# Patient Record
Sex: Female | Born: 2000 | Hispanic: No | Marital: Single | Smoking: Current every day smoker
Health system: Southern US, Community
[De-identification: ages and names within clinical notes are randomized; demographics above are authoritative.]

## PROBLEM LIST (undated history)

## (undated) DIAGNOSIS — R569 Unspecified convulsions: Secondary | ICD-10-CM

---
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
## 2021-06-10 DIAGNOSIS — F1990 Other psychoactive substance use, unspecified, uncomplicated: Secondary | ICD-10-CM | POA: Insufficient documentation

## 2021-07-06 ENCOUNTER — Other Ambulatory Visit: Payer: Self-pay

## 2021-07-06 ENCOUNTER — Encounter: Payer: Self-pay | Admitting: Neurology

## 2021-07-06 ENCOUNTER — Ambulatory Visit: Payer: Medicaid Other | Admitting: Neurology

## 2021-07-06 VITALS — Ht 62.0 in | Wt 110.0 lb

## 2021-07-06 DIAGNOSIS — G40909 Epilepsy, unspecified, not intractable, without status epilepticus: Secondary | ICD-10-CM

## 2021-07-06 MED ORDER — LAMOTRIGINE 25 MG PO TABS: ORAL_TABLET | ORAL | 0 refills | Status: DC

## 2021-07-06 NOTE — Patient Instructions (Addendum)
Continue with Topamax 100 mg twice daily  ?Start with lamotrigine as below  ?Week 1: 25mg  (one pill) at night  ?Week 2: 25mg  (one pill) twice daily ?Week 3: 25mg  (one pill) morning and 50mg  (two pills) night  ?Week 4: 50mg  (two pills) twice daily  ?Week 5: 75mg  (three pills) twice daily ?Week 6: 100mg  (fours pills) twice daily ?Will obtain a Lamotrigine level (blood test) and complete metabolic panel after week 6  ?Please stop the medication and call the office as soon as you develop a new rash ? ?Please contact me to let me know the name of the oral mouth wash.  ? ?Return in 6 weeks  ? ? ?

## 2021-07-06 NOTE — Progress Notes (Signed)
[facility]  PATIENT: Janet SpareMikayla Lacey Kalata DOB: 05/30/2000  REQUESTING CLINICIAN: Eula Friedhamberlin, Patricia, MD HISTORY FROM: Patient and mother  REASON FOR VISIT: New onset seizure   HISTORICAL  CHIEF COMPLAINT:  Chief Complaint  Patient presents with   New Patient (Initial Visit)    Rm 15, with mother  NP/Paper/[facility]/Patricia Erlene Sentershamberlin MD [phone]/new onset seizures/ States the increased dose of Topamax makes her feel bad      HISTORY OF PRESENT ILLNESS:  This is a 21 year old woman with past medical history of anxiety, PTSD, migraine headaches who is presenting after being admitted to the hospital for new onset seizure.  Patient reports on February 7 she had back-to-back seizures, a total of 4 seizures described as generalized convulsion.  She was admitted at [facility] for total of 3 days.  She did have a brain MRI which showed no acute intracranial abnormality and her EEG did not show any seizures.  Due to her history of anxiety and migraine headaches she was started on topiramate.  Currently she is on topiramate 100 mg twice daily, describes side effect of brain fog, difficulty with concentrating and also feels like she is "more fast" meaning that her ADD symptoms are getting worse.  Since leaving the hospital she denies any additional seizure or seizure-like event.  States that her headaches are better controlled.  She denies any previous history of seizures, denies any seizure risk factors other than the fact that she was born 3 weeks premature, with symptom of crack withdrawal and was in the ICU for couple weeks. She does report a history of marijuana abuse and believes that the marijuana she used was laced with cocaine causing her seizures.  In the hospital her urine toxicology was positive for THC and cocaine.  Handedness: Right handed   Onset: February 7  Seizure Type: Generalized convulsion  Current frequency: 1 event of seizure  cluster  Any injuries from seizures: Tongue biting  Seizure risk factors: Premature 3 weeks, was in the ICU for 2 weeks for drug withdrawal   Previous ASMs: None  Currenty ASMs: Levetiracetam  ASMs side effects: Brain fog, difficulty with concentration  Brain Images: Normal brain MRI  Previous EEGs: Normal EEG   OTHER MEDICAL CONDITIONS: Migraines headaches, Depression, PTSD   REVIEW OF SYSTEMS: Full 14 system review of systems performed and negative with exception of: As noted in the HPI  ALLERGIES: Allergies  Allergen Reactions   Influenza Vaccines     Other reaction(s): Other (See Comments) Pt states it makes her sick with fever and nausea     HOME MEDICATIONS: Outpatient Medications Prior to Visit  Medication Sig Dispense Refill   FOLIC ACID PO Take by mouth.     topiramate (TOPAMAX) 100 MG tablet Take by mouth.     No facility-administered medications prior to visit.    PAST MEDICAL HISTORY: History reviewed. No pertinent past medical history.  PAST SURGICAL HISTORY: History reviewed. No pertinent surgical history.  FAMILY HISTORY: History reviewed. No pertinent family history.  SOCIAL HISTORY: Social History   Socioeconomic History   Marital status: Single    Spouse name: Not on file   Number of children: Not on file   Years of education: Not on file   Highest education level: Not on file  Occupational History   Not on file  Tobacco Use   Smoking status: Unknown   Smokeless tobacco: Not on file  Substance and Sexual Activity   Alcohol use: Not on  file   Drug use: Not on file   Sexual activity: Not on file  Other Topics Concern   Not on file  Social History Narrative   Not on file   Social Determinants of Health   Financial Resource Strain: Not on file  Food Insecurity: Not on file  Transportation Needs: Not on file  Physical Activity: Not on file  Stress: Not on file  Social Connections: Not on file  Intimate Partner Violence: Not  on file     PHYSICAL EXAM    GENERAL EXAM/CONSTITUTIONAL: Vitals:  Vitals:   07/06/21 1440  Weight: 110 lb (49.9 kg)  Height: 5\' 2"  (1.575 m)   Body mass index is 20.12 kg/m. Wt Readings from Last 3 Encounters:  07/06/21 110 lb (49.9 kg)   Patient is in no distress; well developed, nourished and groomed; neck is supple  CARDIOVASCULAR: Examination of carotid arteries is normal; no carotid bruits Regular rate and rhythm, no murmurs Examination of peripheral vascular system by observation and palpation is normal  EYES: Pupils round and reactive to light, Visual fields full to confrontation, Extraocular movements intacts,  No results found.  MUSCULOSKELETAL: Gait, strength, tone, movements noted in Neurologic exam below  NEUROLOGIC: MENTAL STATUS:  No flowsheet data found. awake, alert, oriented to person, place and time recent and remote memory intact normal attention and concentration language fluent, comprehension intact, naming intact fund of knowledge appropriate  CRANIAL NERVE:  2nd, 3rd, 4th, 6th - pupils equal and reactive to light, visual fields full to confrontation, extraocular muscles intact, no nystagmus 5th - facial sensation symmetric 7th - facial strength symmetric 8th - hearing intact 9th - palate elevates symmetrically, uvula midline 11th - shoulder shrug symmetric 12th - tongue protrusion midline  MOTOR:  normal bulk and tone, full strength in the BUE, BLE  SENSORY:  normal and symmetric to light touch, pinprick, temperature, vibration  COORDINATION:  finger-nose-finger, fine finger movements normal  REFLEXES:  deep tendon reflexes present and symmetric  GAIT/STATION:  normal   DIAGNOSTIC DATA (LABS, IMAGING, TESTING) - I reviewed patient records, labs, notes, testing and imaging myself where available.  No results found for: WBC, HGB, HCT, MCV, PLT No results found for: NA, K, CL, CO2, GLUCOSE, BUN, CREATININE, CALCIUM, PROT,  ALBUMIN, AST, ALT, ALKPHOS, BILITOT, GFRNONAA, GFRAA No results found for: CHOL, HDL, LDLCALC, LDLDIRECT, TRIG No results found for: HGBA1C No results found for: VITAMINB12 No results found for: TSH  Brain MRI with and without contrast 06/10/21 No MRI evidence of acute pathology within the brain, without evidence of  hemorrhage, mass effect, or infarct. No MRI correlate for seizure-like activity    Routine EEG 06/10/21 Normal EEG, however, do not rule out epilepsy.   I personally reviewed brain Images and previous EEG reports.   ASSESSMENT AND PLAN  21 y.o. year old female  with history of anxiety, PTSD and new onset seizure who is presenting for follow-up.  Patient reported multiple seizures in the period of 24 hours.  Her brain MRI and EEG was negative.  She was started on Topamax 100 mg twice daily due to her history of migraine headaches.  She is compliant with the medication but did report symptoms of brain fogginess, word finding difficulty and feeling like she cannot sit in one place.  Because of her reported side effect of Topamax and history of anxiety, I think is reasonable to switch the patient to lamotrigine.  I tried switching her to Trokendi but it was not  approved by her insurance.  I will start her on Lamictal 25 mg with the plan to uptitrate her to 100 mg twice daily.  At that time, my plan will be to decrease the topiramate to 50 mg twice daily due to patient history of migraines headaches.  Discussed side effect of Lamictal, and to stop the medication if she develop a rash.  I will see her in 6 weeks, at that time I will check a lamotrigine level, and switch to tablet 100 mg.  We will also advised patient to decrease the topiramate to 50 mg twice daily.  Follow-up as directed.   1. Seizure disorder [facility])     Patient Instructions  Continue with Topamax 100 mg twice daily  Start with lamotrigine as below  Week 1: 25mg  (one pill) at night  Week 2: 25mg  (one pill) twice  daily Week 3: 25mg  (one pill) morning and 50mg  (two pills) night  Week 4: 50mg  (two pills) twice daily  Week 5: 75mg  (three pills) twice daily Week 6: 100mg  (fours pills) twice daily Will obtain a Lamotrigine level (blood test) and complete metabolic panel after week 6  Please stop the medication and call the office as soon as you develop a new rash  Please contact me to let me know the name of the oral mouth wash.   Return in 6 weeks    Per [facility] statutes, patients with seizures are not allowed to drive until they have been seizure-free for six months.  Other recommendations include using caution when using heavy equipment or power tools. Avoid working on ladders or at heights. Take showers instead of baths.  Do not swim alone.  Ensure the water temperature is not too high on the home water heater. Do not go swimming alone. Do not lock yourself in a room alone (i.e. bathroom). When caring for infants or small children, sit down when holding, feeding, or changing them to minimize risk of injury to the child in the event you have a seizure. Maintain good sleep hygiene. Avoid alcohol.  Also recommend adequate sleep, hydration, good diet and minimize stress.   During the Seizure  - First, ensure adequate ventilation and place patients on the floor on their left side  Loosen clothing around the neck and ensure the airway is patent. If the patient is clenching the teeth, do not force the mouth open with any object as this can cause severe damage - Remove all items from the surrounding that can be hazardous. The patient may be oblivious to what's happening and may not even know what he or she is doing. If the patient is confused and wandering, either gently guide him/her away and block access to outside areas - Reassure the individual and be comforting - Call 911. In most cases, the seizure ends before EMS arrives. However, there are cases when seizures may last over 3 to 5 minutes. Or  the individual may have developed breathing difficulties or severe injuries. If a pregnant patient or a person with diabetes develops a seizure, it is prudent to call an ambulance. - Finally, if the patient does not regain full consciousness, then call EMS. Most patients will remain confused for about 45 to 90 minutes after a seizure, so you must use judgment in calling for help. - Avoid restraints but make sure the patient is in a bed with padded side rails - Place the individual in a lateral position with the neck slightly flexed; this will help the  saliva drain from the mouth and prevent the tongue from falling backward - Remove all nearby furniture and other hazards from the area - Provide verbal assurance as the individual is regaining consciousness - Provide the patient with privacy if possible - Call for help and start treatment as ordered by the caregiver   After the Seizure (Postictal Stage)  After a seizure, most patients experience confusion, fatigue, muscle pain and/or a headache. Thus, one should permit the individual to sleep. For the next few days, reassurance is essential. Being calm and helping reorient the person is also of importance.  Most seizures are painless and end spontaneously. Seizures are not harmful to others but can lead to complications such as stress on the lungs, brain and the heart. Individuals with prior lung problems may develop labored breathing and respiratory distress.     No orders of the defined types were placed in this encounter.   Meds ordered this encounter  Medications   lamoTRIgine (LAMICTAL) 25 MG tablet    Sig: Week 1: 25mg  (one pill) at night then Week 2: 25mg  (one pill) twice daily then Week 3: 25mg  (one pill) morning and 50mg  (two pills) night then Week 4: 50mg  (two pills) twice daily then Week 5: 75mg  (three pills) twice daily and then Week 6: 100mg  (fours pills) twice daily    Dispense:  240 tablet    Refill:  0    Return in about 6  weeks (around 08/17/2021).  I have spent a total of 47 minutes dedicated to this patient today, preparing to see patient, performing a medically appropriate examination and evaluation, ordering tests and/or medications and procedures, and counseling and educating the patient/family/caregiver; independently interpreting result and communicating results to the family/patient/caregiver; and documenting clinical information in the electronic medical record.   Windell Norfolk, MD 07/06/2021, 10:21 PM  [facility] [address] [place], [place] [address] [phone]

## 2021-07-07 ENCOUNTER — Other Ambulatory Visit: Payer: Self-pay | Admitting: Neurology

## 2021-07-07 MED ORDER — CHLORHEXIDINE GLUCONATE 0.12 % MT SOLN: 15.0000 mL | Freq: Two times a day (BID) | OROMUCOSAL | 0 refills | Status: AC

## 2021-07-16 ENCOUNTER — Encounter: Payer: Self-pay | Admitting: Neurology

## 2021-07-29 ENCOUNTER — Encounter: Payer: Self-pay | Admitting: Neurology

## 2021-07-29 MED ORDER — TOPIRAMATE 100 MG PO TABS
100.0000 mg | ORAL_TABLET | Freq: Two times a day (BID) | ORAL | 5 refills | Status: DC
Start: 2021-07-29 — End: 2021-08-31

## 2021-08-17 ENCOUNTER — Ambulatory Visit: Payer: Medicaid Other | Admitting: Neurology

## 2021-08-17 ENCOUNTER — Encounter: Payer: Self-pay | Admitting: Neurology

## 2021-08-17 VITALS — BP 114/73 | HR 102 | Ht 62.0 in | Wt 101.0 lb

## 2021-08-17 DIAGNOSIS — F419 Anxiety disorder, unspecified: Secondary | ICD-10-CM | POA: Diagnosis not present

## 2021-08-17 DIAGNOSIS — R569 Unspecified convulsions: Secondary | ICD-10-CM | POA: Diagnosis not present

## 2021-08-17 NOTE — Progress Notes (Signed)
? ?[facility] ? ?PATIENT: Janet Hurst ?DOB: 03-07-2001 ? ?REQUESTING CLINICIAN: Marnette Burgess, MD ?HISTORY FROM: Patient and mother  ?REASON FOR VISIT: New onset seizure ? ? ?HISTORICAL ? ?CHIEF COMPLAINT:  ?Chief Complaint  ?Patient presents with  ? Follow-up  ?  Room 12, with mother  ?Pt reports no seizures, states she is not doing well on lamictal, causing weight loss, hair loss, and mood swings, she started weaning herself off, she is currently taking 25 mg in am and 50 mg in pm    ? ? ?INTERVAL HISTORY 08/17/2021:  ?Patient presents today for follow-up, she is accompanied by her mother.  She denies any seizure or seizure-like activity since her last visit.  However, she reports side effect of mood swings, weight loss, change in appetite, and lack of sleep.  Patient reports the main side effect is mood swings that she attributed to lamotrigine.  She has self weaned herself from lamotrigine and currently taking 25 mg in the morning and 50 mg at night.  She is planning to decrease to 25 mg twice daily for 1 week then stop the medication.  In terms of her headaches, she reported her headaches are okay, she is on Topamax 50 mg twice daily and also would like to come off the medication or at least reduce it.  ? ? ?HISTORY OF PRESENT ILLNESS:  ?This is a 21 year old woman with past medical history of anxiety, PTSD, migraine headaches who is presenting after being admitted to the hospital for new onset seizure.  Patient reports on February 7 she had back-to-back seizures, a total of 4 seizures described as generalized convulsion.  She was admitted at [facility] for total of 3 days.  She did have a brain MRI which showed no acute intracranial abnormality and her EEG did not show any seizures.  Due to her history of anxiety and migraine headaches she was started on topiramate.  Currently she is on topiramate 100 mg twice daily, describes side effect of brain fog, difficulty with  concentrating and also feels like she is "more fast" meaning that her ADD symptoms are getting worse.  Since leaving the hospital she denies any additional seizure or seizure-like event.  States that her headaches are better controlled.  She denies any previous history of seizures, denies any seizure risk factors other than the fact that she was born 3 weeks premature, with symptom of crack withdrawal and was in the ICU for couple weeks. ?She does report a history of marijuana abuse and believes that the marijuana she used was laced with cocaine causing her seizures.  In the hospital her urine toxicology was positive for THC and cocaine. ? ?Handedness: Right handed  ? ?Onset: February 7 ? ?Seizure Type: Generalized convulsion ? ?Current frequency: 1 event of seizure cluster ? ?Any injuries from seizures: Tongue biting ? ?Seizure risk factors: Premature 3 weeks, was in the ICU for 2 weeks for drug withdrawal  ? ?Previous ASMs: None ? ?Currenty ASMs: Levetiracetam ? ?ASMs side effects: Brain fog, difficulty with concentration ? ?Brain Images: Normal brain MRI ? ?Previous EEGs: Normal EEG ? ? ?OTHER MEDICAL CONDITIONS: Migraines headaches, Depression, PTSD  ? ?REVIEW OF SYSTEMS: Full 14 system review of systems performed and negative with exception of: As noted in the HPI ? ?ALLERGIES: ?Allergies  ?Allergen Reactions  ? Influenza Vaccines   ?  Other reaction(s): Other (See Comments) ?Pt states it makes her sick with fever and nausea ?  ? ? ?HOME  MEDICATIONS: ?Outpatient Medications Prior to Visit  ?Medication Sig Dispense Refill  ? chlorhexidine (PERIDEX) 0.12 % solution Use as directed 15 mLs in the mouth or throat 2 (two) times daily. 120 mL 0  ? FOLIC ACID PO Take by mouth.    ? lamoTRIgine (LAMICTAL) 25 MG tablet Week 1: 25mg  (one pill) at night then Week 2: 25mg  (one pill) twice daily then Week 3: 25mg  (one pill) morning and 50mg  (two pills) night then Week 4: 50mg  (two pills) twice daily then Week 5: 75mg  (three  pills) twice daily and then Week 6: 100mg  (fours pills) twice daily 240 tablet 0  ? topiramate (TOPAMAX) 100 MG tablet Take 1 tablet (100 mg total) by mouth 2 (two) times daily. 60 tablet 5  ? ?No facility-administered medications prior to visit.  ? ? ?PAST MEDICAL HISTORY: ?History reviewed. No pertinent past medical history. ? ?PAST SURGICAL HISTORY: ?History reviewed. No pertinent surgical history. ? ?FAMILY HISTORY: ?History reviewed. No pertinent family history. ? ?SOCIAL HISTORY: ?Social History  ? ?Socioeconomic History  ? Marital status: Single  ?  Spouse name: Not on file  ? Number of children: Not on file  ? Years of education: Not on file  ? Highest education level: Not on file  ?Occupational History  ? Not on file  ?Tobacco Use  ? Smoking status: Unknown  ? Smokeless tobacco: Not on file  ?Substance and Sexual Activity  ? Alcohol use: Not on file  ? Drug use: Not on file  ? Sexual activity: Not on file  ?Other Topics Concern  ? Not on file  ?Social History Narrative  ? Not on file  ? ?Social Determinants of Health  ? ?Financial Resource Strain: Not on file  ?Food Insecurity: Not on file  ?Transportation Needs: Not on file  ?Physical Activity: Not on file  ?Stress: Not on file  ?Social Connections: Not on file  ?Intimate Partner Violence: Not on file  ? ? ?PHYSICAL EXAM ? ?GENERAL EXAM/CONSTITUTIONAL: ?Vitals:  ?Vitals:  ? 08/17/21 1421  ?BP: 114/73  ?Pulse: (!) 102  ?Weight: 101 lb (45.8 kg)  ?Height: 5\' 2"  (1.575 m)  ? ? ?Body mass index is 18.47 kg/m?. ?Wt Readings from Last 3 Encounters:  ?08/17/21 101 lb (45.8 kg)  ?07/06/21 110 lb (49.9 kg)  ? ?Patient is in no distress; well developed, nourished and groomed; neck is supple ? ?EYES: ?Pupils round and reactive to light, Visual fields full to confrontation, Extraocular movements intacts,  ?No results found. ? ?MUSCULOSKELETAL: ?Gait, strength, tone, movements noted in Neurologic exam below ? ?NEUROLOGIC: ?MENTAL STATUS:  ?   ? View : No data to  display.  ?  ?  ?  ? ?awake, alert, oriented to person, place and time ?recent and remote memory intact ?normal attention and concentration ?language fluent, comprehension intact, naming intact ?fund of knowledge appropriate ? ?CRANIAL NERVE:  ?2nd, 3rd, 4th, 6th - pupils equal and reactive to light, visual fields full to confrontation, extraocular muscles intact, no nystagmus ?5th - facial sensation symmetric ?7th - facial strength symmetric ?8th - hearing intact ?9th - palate elevates symmetrically, uvula midline ?11th - shoulder shrug symmetric ?12th - tongue protrusion midline ? ?MOTOR:  ?normal bulk and tone, full strength in the BUE, BLE ? ?SENSORY:  ?normal and symmetric to light touch, pinprick, temperature, vibration ? ?COORDINATION:  ?finger-nose-finger, fine finger movements normal ? ?REFLEXES:  ?deep tendon reflexes present and symmetric ? ?GAIT/STATION:  ?normal ? ? ?DIAGNOSTIC DATA (LABS, IMAGING, TESTING) ?-  I reviewed patient records, labs, notes, testing and imaging myself where available. ? ?No results found for: WBC, HGB, HCT, MCV, PLT ?No results found for: NA, K, CL, CO2, GLUCOSE, BUN, CREATININE, CALCIUM, PROT, ALBUMIN, AST, ALT, ALKPHOS, BILITOT, GFRNONAA, GFRAA ?No results found for: CHOL, HDL, LDLCALC, LDLDIRECT, TRIG ?No results found for: HGBA1C ?No results found for: VITAMINB12 ?No results found for: TSH ? ?Brain MRI with and without contrast 06/10/21 ?No MRI evidence of acute pathology within the brain, without evidence of  hemorrhage, mass effect, or infarct. No MRI correlate for seizure-like activity  ? ? ?Routine EEG 06/10/21 ?Normal EEG, however, do not rule out epilepsy.  ? ?I personally reviewed brain Images and previous EEG reports.  ? ?ASSESSMENT AND PLAN ? ?21 y.o. year old female  with history of anxiety, PTSD and new onset seizure in the setting of drug use who is presenting for follow-up.  She denies any seizure or seizure like activity since her last visit. She does report side  effect of weight loss, mood swings, irritability, lack of sleep that she attributes to lamotrigine.  She self decreased the medication with plan to discontinue.  Currently she is on 25 mg in the morning and 50 mg

## 2021-08-17 NOTE — Patient Instructions (Addendum)
Decrease Lamictal to 25 mg twice daily for one week  ?Then decrease to 25 mg daily for one week, then discontinue the medication  ?Decrease Topiramate to 50 mg nightly  ?Follow up with your PCP and return as needed  ?

## 2021-08-29 ENCOUNTER — Emergency Department (HOSPITAL_COMMUNITY): Payer: Medicaid Other

## 2021-08-29 ENCOUNTER — Encounter (HOSPITAL_COMMUNITY): Payer: Self-pay

## 2021-08-29 ENCOUNTER — Other Ambulatory Visit: Payer: Self-pay

## 2021-08-29 ENCOUNTER — Observation Stay (HOSPITAL_COMMUNITY)
Admission: EM | Admit: 2021-08-29 | Discharge: 2021-08-31 | Disposition: A | Discharge status: Home / Self Care | Payer: Medicaid Other | Attending: Internal Medicine | Admitting: Internal Medicine

## 2021-08-29 DIAGNOSIS — Z79899 Other long term (current) drug therapy: Secondary | ICD-10-CM | POA: Insufficient documentation

## 2021-08-29 DIAGNOSIS — R82998 Other abnormal findings in urine: Secondary | ICD-10-CM | POA: Diagnosis not present

## 2021-08-29 DIAGNOSIS — G40909 Epilepsy, unspecified, not intractable, without status epilepticus: Principal | ICD-10-CM | POA: Insufficient documentation

## 2021-08-29 DIAGNOSIS — R569 Unspecified convulsions: Secondary | ICD-10-CM | POA: Diagnosis present

## 2021-08-29 DIAGNOSIS — E876 Hypokalemia: Secondary | ICD-10-CM | POA: Diagnosis not present

## 2021-08-29 DIAGNOSIS — D72829 Elevated white blood cell count, unspecified: Secondary | ICD-10-CM | POA: Diagnosis not present

## 2021-08-29 HISTORY — DX: Unspecified convulsions: R56.9

## 2021-08-29 LAB — CBC WITH DIFFERENTIAL/PLATELET
Abs Immature Granulocytes: 0.2 10*3/uL — ABNORMAL HIGH (ref 0.00–0.07)
Basophils Absolute: 0.1 10*3/uL (ref 0.0–0.1)
Basophils Relative: 0 %
Eosinophils Absolute: 0.1 10*3/uL (ref 0.0–0.5)
Eosinophils Relative: 0 %
HCT: 41.7 % (ref 36.0–46.0)
Hemoglobin: 12.7 g/dL (ref 12.0–15.0)
Immature Granulocytes: 1 %
Lymphocytes Relative: 15 %
Lymphs Abs: 3.5 10*3/uL (ref 0.7–4.0)
MCH: 30.6 pg (ref 26.0–34.0)
MCHC: 30.5 g/dL (ref 30.0–36.0)
MCV: 100.5 fL — ABNORMAL HIGH (ref 80.0–100.0)
Monocytes Absolute: 1.1 10*3/uL — ABNORMAL HIGH (ref 0.1–1.0)
Monocytes Relative: 5 %
Neutro Abs: 19.1 10*3/uL — ABNORMAL HIGH (ref 1.7–7.7)
Neutrophils Relative %: 79 %
Platelets: 518 10*3/uL — ABNORMAL HIGH (ref 150–400)
RBC: 4.15 MIL/uL (ref 3.87–5.11)
RDW: 12.9 % (ref 11.5–15.5)
WBC: 24 10*3/uL — ABNORMAL HIGH (ref 4.0–10.5)
nRBC: 0 % (ref 0.0–0.2)

## 2021-08-29 LAB — I-STAT BETA HCG BLOOD, ED (MC, WL, AP ONLY): I-stat hCG, quantitative: <5 m[IU]/mL (ref <5)

## 2021-08-29 LAB — CBG MONITORING, ED: Glucose-Capillary: 198 mg/dL — ABNORMAL HIGH (ref 70–99)

## 2021-08-29 MED ORDER — LEVETIRACETAM IN NACL 1000 MG/100ML IV SOLN
1000.0000 mg | Freq: Once | INTRAVENOUS | Status: AC
  Administered 2021-08-29: 1000 mg via INTRAVENOUS
  Filled 2021-08-29: qty 100

## 2021-08-29 MED ORDER — LORAZEPAM 2 MG/ML IJ SOLN
INTRAMUSCULAR | Status: AC
Start: 2021-08-29 — End: 2021-08-29
  Administered 2021-08-29: 2 mg via INTRAVENOUS
  Filled 2021-08-29: qty 1

## 2021-08-29 MED ORDER — LORAZEPAM 2 MG/ML IJ SOLN: 2.0000 mg | Freq: Once | INTRAMUSCULAR | Status: AC

## 2021-08-29 MED ORDER — LORAZEPAM 2 MG/ML IJ SOLN
1.0000 mg | Freq: Once | INTRAMUSCULAR | Status: AC
Start: 2021-08-30 — End: 2021-08-30
  Administered 2021-08-30: 1 mg via INTRAVENOUS
  Filled 2021-08-29: qty 1
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
## 2021-08-30 ENCOUNTER — Observation Stay (HOSPITAL_COMMUNITY): Payer: Medicaid Other

## 2021-08-30 ENCOUNTER — Encounter (HOSPITAL_COMMUNITY): Payer: Self-pay | Admitting: Internal Medicine

## 2021-08-30 ENCOUNTER — Emergency Department (HOSPITAL_COMMUNITY): Payer: Medicaid Other

## 2021-08-30 DIAGNOSIS — G40909 Epilepsy, unspecified, not intractable, without status epilepticus: Secondary | ICD-10-CM

## 2021-08-30 DIAGNOSIS — R569 Unspecified convulsions: Secondary | ICD-10-CM | POA: Diagnosis not present

## 2021-08-30 LAB — RAPID URINE DRUG SCREEN, HOSP PERFORMED
Amphetamines: NOT DETECTED
Barbiturates: NOT DETECTED
Benzodiazepines: POSITIVE — AB
Cocaine: NOT DETECTED
Opiates: NOT DETECTED
Tetrahydrocannabinol: POSITIVE — AB

## 2021-08-30 LAB — URINALYSIS, ROUTINE W REFLEX MICROSCOPIC
Bilirubin Urine: NEGATIVE
Glucose, UA: NEGATIVE mg/dL
Hgb urine dipstick: NEGATIVE
Ketones, ur: NEGATIVE mg/dL
Nitrite: NEGATIVE
Protein, ur: NEGATIVE mg/dL
Specific Gravity, Urine: 1.008 (ref 1.005–1.030)
pH: 5 (ref 5.0–8.0)

## 2021-08-30 LAB — CBC
HCT: 36 % (ref 36.0–46.0)
Hemoglobin: 11.4 g/dL — ABNORMAL LOW (ref 12.0–15.0)
MCH: 29.7 pg (ref 26.0–34.0)
MCHC: 31.7 g/dL (ref 30.0–36.0)
MCV: 93.8 fL (ref 80.0–100.0)
Platelets: 397 10*3/uL (ref 150–400)
RBC: 3.84 MIL/uL — ABNORMAL LOW (ref 3.87–5.11)
RDW: 13.1 % (ref 11.5–15.5)
WBC: 18.7 10*3/uL — ABNORMAL HIGH (ref 4.0–10.5)
nRBC: 0 % (ref 0.0–0.2)

## 2021-08-30 LAB — BASIC METABOLIC PANEL
Anion gap: 21 — ABNORMAL HIGH (ref 5–15)
BUN: 11 mg/dL (ref 6–20)
CO2: 11 mmol/L — ABNORMAL LOW (ref 22–32)
Calcium: 9.3 mg/dL (ref 8.9–10.3)
Chloride: 108 mmol/L (ref 98–111)
Creatinine, Ser: 0.88 mg/dL (ref 0.44–1.00)
GFR, Estimated: >60 mL/min (ref >60)
Glucose, Bld: 230 mg/dL — ABNORMAL HIGH (ref 70–99)
Potassium: 3.8 mmol/L (ref 3.5–5.1)
Sodium: 140 mmol/L (ref 135–145)

## 2021-08-30 LAB — HEMOGLOBIN A1C
Hgb A1c MFr Bld: 5.3 % (ref 4.8–5.6)
Mean Plasma Glucose: 105.41 mg/dL

## 2021-08-30 LAB — HIV ANTIBODY (ROUTINE TESTING W REFLEX): HIV Screen 4th Generation wRfx: NONREACTIVE

## 2021-08-30 LAB — CREATININE, SERUM
Creatinine, Ser: 0.96 mg/dL (ref 0.44–1.00)
GFR, Estimated: >60 mL/min (ref >60)

## 2021-08-30 MED ORDER — LORATADINE 10 MG PO TABS: 10.0000 mg | ORAL_TABLET | Freq: Every day | ORAL | Status: DC | PRN

## 2021-08-30 MED ORDER — IBUPROFEN 200 MG PO TABS: 800.0000 mg | ORAL_TABLET | Freq: Four times a day (QID) | ORAL | Status: DC | PRN

## 2021-08-30 MED ORDER — ENOXAPARIN SODIUM 40 MG/0.4ML IJ SOSY
40.0000 mg | PREFILLED_SYRINGE | INTRAMUSCULAR | Status: DC
  Administered 2021-08-30 – 2021-08-31 (×2): 40 mg via SUBCUTANEOUS
  Filled 2021-08-30 (×2): qty 0.4

## 2021-08-30 MED ORDER — ACETAMINOPHEN 650 MG RE SUPP
650.0000 mg | Freq: Four times a day (QID) | RECTAL | Status: DC | PRN
Start: 2021-08-30 — End: 2021-08-31

## 2021-08-30 MED ORDER — ACETAMINOPHEN 325 MG PO TABS: 650.0000 mg | ORAL_TABLET | Freq: Four times a day (QID) | ORAL | Status: DC | PRN

## 2021-08-30 MED ORDER — FOLIC ACID 1 MG PO TABS
1.0000 mg | ORAL_TABLET | Freq: Every day | ORAL | Status: DC
  Administered 2021-08-30 – 2021-08-31 (×2): 1 mg via ORAL
  Filled 2021-08-30 (×2): qty 1

## 2021-08-30 MED ORDER — LORAZEPAM 2 MG/ML IJ SOLN
1.0000 mg | Freq: Once | INTRAMUSCULAR | Status: AC
  Administered 2021-08-30: 1 mg via INTRAVENOUS
  Filled 2021-08-30: qty 1

## 2021-08-30 MED ORDER — LEVETIRACETAM 500 MG PO TABS
500.0000 mg | ORAL_TABLET | Freq: Two times a day (BID) | ORAL | Status: DC
Start: 2021-08-30 — End: 2021-08-31
  Administered 2021-08-30 – 2021-08-31 (×3): 500 mg via ORAL
  Filled 2021-08-30 (×4): qty 1

## 2021-08-30 MED ORDER — FLUTICASONE PROPIONATE 50 MCG/ACT NA SUSP
1.0000 | Freq: Every day | NASAL | Status: DC | PRN
  Filled 2021-08-30: qty 16

## 2021-08-30 MED ORDER — ONDANSETRON HCL 4 MG/2ML IJ SOLN
4.0000 mg | Freq: Four times a day (QID) | INTRAMUSCULAR | Status: DC | PRN
  Administered 2021-08-31: 4 mg via INTRAVENOUS
  Filled 2021-08-30: qty 2
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
## 2021-08-30 NOTE — ED Provider Notes (Signed)
Care assumed from Achilles Dunk MD/Madison Kommer MD, patient with seizure disorder having had 2 seizures tonight.  She was signed out pending CT results.  CT shows no acute process.  I have independently viewed the images, and agree with radiologist interpretation.  Patient did require 4 mg of lorazepam to be sedated for her CT scan.  She is now waking up and is arousable and oriented.  However, neurology consultation is appreciated.  Neurology recommends observation admission and repeat CT scan.  She had received a loading dose of levetiracetam in the ED.  Case is discussed with Dr. Hal Hope of [facility], who agrees to admit the patient. ? ?Results for orders placed or performed during the hospital encounter of 08/29/21  ?CBC with Differential  ?Result Value Ref Range  ? WBC 24.0 (H) 4.0 - 10.5 K/uL  ? RBC 4.15 3.87 - 5.11 MIL/uL  ? Hemoglobin 12.7 12.0 - 15.0 g/dL  ? HCT 41.7 36.0 - 46.0 %  ? MCV 100.5 (H) 80.0 - 100.0 fL  ? MCH 30.6 26.0 - 34.0 pg  ? MCHC 30.5 30.0 - 36.0 g/dL  ? RDW 12.9 11.5 - 15.5 %  ? Platelets 518 (H) 150 - 400 K/uL  ? nRBC 0.0 0.0 - 0.2 %  ? Neutrophils Relative % 79 %  ? Neutro Abs 19.1 (H) 1.7 - 7.7 K/uL  ? Lymphocytes Relative 15 %  ? Lymphs Abs 3.5 0.7 - 4.0 K/uL  ? Monocytes Relative 5 %  ? Monocytes Absolute 1.1 (H) 0.1 - 1.0 K/uL  ? Eosinophils Relative 0 %  ? Eosinophils Absolute 0.1 0.0 - 0.5 K/uL  ? Basophils Relative 0 %  ? Basophils Absolute 0.1 0.0 - 0.1 K/uL  ? Immature Granulocytes 1 %  ? Abs Immature Granulocytes 0.20 (H) 0.00 - 0.07 K/uL  ?Basic metabolic panel  ?Result Value Ref Range  ? Sodium 140 135 - 145 mmol/L  ? Potassium 3.8 3.5 - 5.1 mmol/L  ? Chloride 108 98 - 111 mmol/L  ? CO2 11 (L) 22 - 32 mmol/L  ? Glucose, Bld 230 (H) 70 - 99 mg/dL  ? BUN 11 6 - 20 mg/dL  ? Creatinine, Ser 0.88 0.44 - 1.00 mg/dL  ? Calcium 9.3 8.9 - 10.3 mg/dL  ? GFR, Estimated >60 >60 mL/min  ? Anion gap 21 (H) 5 - 15  ?I-Stat Beta hCG blood, ED ([facility], [facility], [facility] only)  ?Result Value  Ref Range  ? I-stat hCG, quantitative <5.0 <5 mIU/mL  ? Comment 3          ?POC CBG, ED  ?Result Value Ref Range  ? Glucose-Capillary 198 (H) 70 - 99 mg/dL  ? ?CT Head Wo Contrast ? ?Result Date: 08/30/2021 ?CLINICAL DATA:  Recent seizure activity EXAM: CT HEAD WITHOUT CONTRAST TECHNIQUE: Contiguous axial images were obtained from the base of the skull through the vertex without intravenous contrast. RADIATION DOSE REDUCTION: This exam was performed according to the departmental dose-optimization program which includes automated exposure control, adjustment of the mA and/or kV according to patient size and/or use of iterative reconstruction technique. COMPARISON:  None. FINDINGS: Brain: No evidence of acute infarction, hemorrhage, hydrocephalus, extra-axial collection or mass lesion/mass effect. Vascular: No hyperdense vessel or unexpected calcification. Skull: Normal. Negative for fracture or focal lesion. Sinuses/Orbits: No acute finding. Other: None. IMPRESSION: No acute intracranial abnormality noted. Electronically Signed   By: Inez Catalina M.D.   On: 08/30/2021 01:31   ? ? ?  ?Delora Fuel, MD ?  08/30/21 0443 ? ?

## 2021-08-30 NOTE — ED Notes (Signed)
Pt adopted mother informed RN the pts birth family has a hx of leukemia and brain cancer. Possible other cancers. Mental illness also runs in both sides of pts biological family.  ?

## 2021-08-30 NOTE — Procedures (Addendum)
Patient Name: Janet Hurst  ?MRN: [id number]  ?Epilepsy Attending: Lora Havens  ?Referring Physician/Provider: Rise Patience, MD ?Date: 08/30/2021 ?Duration: 22.33 mins ? ?Patient history: 21 y.o. female with PMH significant for PTSD, anxiety, migraines and seizures and being weaned off Lamictal and Topamax due to side effects including weight loss, mood swings and irritability. She presents to the ED after a witnessed seizure and had 2 additional seizures.  EEG to evaluate for seizure. ? ?Level of alertness: Awake, asleep ? ?AEDs during EEG study: LEV, Ativan ? ?Technical aspects: This EEG study was done with scalp electrodes positioned according to the 10-20 International system of electrode placement. Electrical activity was acquired at a sampling rate of 500Hz  and reviewed with a high frequency filter of 70Hz  and a low frequency filter of 1Hz . EEG data were recorded continuously and digitally stored.  ? ?Description: During awake state, no clear posterior dominant rhythm was seen. Background consisted of low amplitude 13 to 14 Hz generalized beta activity.  Sleep was characterized by vertex waves, sleep spindles (12 to 14 Hz), maximal frontocentral region.  Small sharp spikes (BETS) were noted in left temporal region. Hyperventilation and photic stimulation were not performed.    ? ?IMPRESSION: ?This study is within normal limits. No seizures or epileptiform discharges were seen throughout the recording. ? ?Lora Havens  ? ?
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
## 2021-08-30 NOTE — Discharge Instructions (Signed)
Seizure precautions: °Per [place] DMV statutes, patients with seizures are not allowed to drive until they have been seizure-free for six months and cleared by a physician  °  °Use caution when using heavy equipment or power tools. Avoid working on ladders or at heights. Take showers instead of baths. Ensure the water temperature is not too high on the home water heater. Do not go swimming alone. Do not lock yourself in a room alone (i.e. bathroom). When caring for infants or small children, sit down when holding, feeding, or changing them to minimize risk of injury to the child in the event you have a seizure. Maintain good sleep hygiene. Avoid alcohol.  °  °If patient has another seizure, call 911 and bring them back to the ED if: °A.  The seizure lasts longer than 5 minutes.      °B.  The patient doesn't wake shortly after the seizure or has new problems such as difficulty seeing, speaking or moving following the seizure °C.  The patient was injured during the seizure °D.  The patient has a temperature over 102 F (39C) °E.  The patient vomited during the seizure and now is having trouble breathing °   °During the Seizure °  °- First, ensure adequate ventilation and place patients on the floor on their left side  °Loosen clothing around the neck and ensure the airway is patent. If the patient is clenching the teeth, do not force the mouth open with any object as this can cause severe damage °- Remove all items from the surrounding that can be hazardous. The patient may be oblivious to what's happening and may not even know what he or she is doing. °If the patient is confused and wandering, either gently guide him/her away and block access to outside areas °- Reassure the individual and be comforting °- Call 911. In most cases, the seizure ends before EMS arrives. However, there are cases when seizures may last over 3 to 5 minutes. Or the individual may have developed breathing difficulties or severe  injuries. If a pregnant patient or a person with diabetes develops a seizure, it is prudent to call an ambulance. °- Finally, if the patient does not regain full consciousness, then call EMS. Most patients will remain confused for about 45 to 90 minutes after a seizure, so you must use judgment in calling for help. °- Avoid restraints but make sure the patient is in a bed with padded side rails °- Place the individual in a lateral position with the neck slightly flexed; this will help the saliva drain from the mouth and prevent the tongue from falling backward °- Remove all nearby furniture and other hazards from the area °- Provide verbal assurance as the individual is regaining consciousness °- Provide the patient with privacy if possible °- Call for help and start treatment as ordered by the caregiver °  ° After the Seizure (Postictal Stage) °  °After a seizure, most patients experience confusion, fatigue, muscle pain and/or a headache. Thus, one should permit the individual to sleep. For the next few days, reassurance is essential. Being calm and helping reorient the person is also of importance. °  °Most seizures are painless and end spontaneously. Seizures are not harmful to others but can lead to complications such as stress on the lungs, brain and the heart. Individuals with prior lung problems may develop labored breathing and respiratory distress.  °  °

## 2021-08-30 NOTE — Progress Notes (Signed)
EEG complete - results pending 

## 2021-08-30 NOTE — ED Notes (Signed)
EEG at bedside.

## 2021-08-30 NOTE — ED Notes (Signed)
Pt sleeping in bed, respirations even, unlabored, regular. Cardiac monitoring, BP and continuous pulse oximetry monitoring active with alarms set ?

## 2021-08-31 ENCOUNTER — Other Ambulatory Visit (HOSPITAL_COMMUNITY): Payer: Self-pay

## 2021-08-31 ENCOUNTER — Encounter (HOSPITAL_COMMUNITY): Payer: Self-pay | Admitting: Internal Medicine

## 2021-08-31 DIAGNOSIS — E876 Hypokalemia: Secondary | ICD-10-CM

## 2021-08-31 DIAGNOSIS — R569 Unspecified convulsions: Secondary | ICD-10-CM | POA: Diagnosis not present

## 2021-08-31 LAB — URINE CULTURE

## 2021-08-31 LAB — CBC WITH DIFFERENTIAL/PLATELET
Abs Immature Granulocytes: 0.01 10*3/uL (ref 0.00–0.07)
Basophils Absolute: 0 10*3/uL (ref 0.0–0.1)
Basophils Relative: 0 %
Eosinophils Absolute: 0.1 10*3/uL (ref 0.0–0.5)
Eosinophils Relative: 1 %
HCT: 35.6 % — ABNORMAL LOW (ref 36.0–46.0)
Hemoglobin: 11.6 g/dL — ABNORMAL LOW (ref 12.0–15.0)
Immature Granulocytes: 0 %
Lymphocytes Relative: 35 %
Lymphs Abs: 3.1 10*3/uL (ref 0.7–4.0)
MCH: 30.1 pg (ref 26.0–34.0)
MCHC: 32.6 g/dL (ref 30.0–36.0)
MCV: 92.5 fL (ref 80.0–100.0)
Monocytes Absolute: 0.8 10*3/uL (ref 0.1–1.0)
Monocytes Relative: 9 %
Neutro Abs: 5 10*3/uL (ref 1.7–7.7)
Neutrophils Relative %: 55 %
Platelets: 371 10*3/uL (ref 150–400)
RBC: 3.85 MIL/uL — ABNORMAL LOW (ref 3.87–5.11)
RDW: 13.1 % (ref 11.5–15.5)
WBC: 9.1 10*3/uL (ref 4.0–10.5)
nRBC: 0 % (ref 0.0–0.2)

## 2021-08-31 LAB — BASIC METABOLIC PANEL
Anion gap: 12 (ref 5–15)
BUN: 8 mg/dL (ref 6–20)
CO2: 22 mmol/L (ref 22–32)
Calcium: 8.9 mg/dL (ref 8.9–10.3)
Chloride: 103 mmol/L (ref 98–111)
Creatinine, Ser: 0.97 mg/dL (ref 0.44–1.00)
GFR, Estimated: >60 mL/min (ref >60)
Glucose, Bld: 91 mg/dL (ref 70–99)
Potassium: 3.3 mmol/L — ABNORMAL LOW (ref 3.5–5.1)
Sodium: 137 mmol/L (ref 135–145)

## 2021-08-31 LAB — MAGNESIUM: Magnesium: 1.9 mg/dL (ref 1.7–2.4)

## 2021-08-31 MED ORDER — POTASSIUM CHLORIDE CRYS ER 10 MEQ PO TBCR
40.0000 meq | EXTENDED_RELEASE_TABLET | Freq: Once | ORAL | Status: AC
Start: 2021-08-31 — End: 2021-08-31
  Administered 2021-08-31: 40 meq via ORAL
  Filled 2021-08-31: qty 4

## 2021-08-31 MED ORDER — LEVETIRACETAM 500 MG PO TABS
500.0000 mg | ORAL_TABLET | Freq: Two times a day (BID) | ORAL | 1 refills | Status: DC
  Filled 2021-08-31: qty 60, 30d supply, fill #0

## 2021-08-31 NOTE — TOC Transition Note (Signed)
Transition of Care (TOC) - CM/SW Discharge Note ? ? ?Patient Details  ?Name: Janet Hurst ?MRN: [id number] ?Date of Birth: 2000/07/08 ? ?Transition of Care (TOC) CM/SW Contact:  ?Pollie Friar, RN ?Phone Number: ?08/31/2021, 3:17 PM ? ? ?Clinical Narrative:    ?Patient is discharging home with self care. Medications for home to be delivered to the room per [place].  ?Pt has transportation home.  ? ? ?Final next level of care: Home/Self Care ?Barriers to Discharge: No Barriers Identified ? ? ?Patient Goals and CMS Choice ?  ?  ?  ? ?Discharge Placement ?  ?           ?  ?  ?  ?  ? ?Discharge Plan and Services ?  ?  ?           ?  ?  ?  ?  ?  ?  ?  ?  ?  ?  ? ?Social Determinants of Health (SDOH) Interventions ?  ? ? ?Readmission Risk Interventions ?   ? View : No data to display.  ?  ?  ?  ? ? ? ? ? ?
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
## 2021-08-31 NOTE — Progress Notes (Signed)
Nursing Discharge Note ?  ?Admit Date: 08/29/2021 ? ?Discharge date: 08/31/2021 ?  ?Zarya Lasseigne is to be discharged home per MD order.  AVS completed. Reviewed with patient and mother at bedside. Highlighted copy provided for patient to take home.  ?Patient and mother able to verbalize understanding of discharge instructions. PIV removed. Patient stable upon discharge.  ? ?Discharge Instructions   ? ? Diet general   Complete by: As directed ?  ? Increase activity slowly   Complete by: As directed ?  ? ?  ?  ?Allergies as of 08/31/2021   ? ?   Reactions  ? Influenza Vaccines   ? Other reaction(s): Other (See Comments) ?Pt states it makes her sick with fever and nausea  ? ?  ? ?  ?Medication List  ?  ? ?STOP taking these medications   ? ?lamoTRIgine 25 MG tablet ?Commonly known as: LAMICTAL ?  ?topiramate 100 MG tablet ?Commonly known as: TOPAMAX ?  ? ?  ? ?TAKE these medications   ? ?acetaminophen 500 MG tablet ?Commonly known as: TYLENOL ?Take 1,000 mg by mouth every 6 (six) hours as needed for moderate pain or headache. ?  ?chlorhexidine 0.12 % solution ?Commonly known as: PERIDEX ?Use as directed 15 mLs in the mouth or throat 2 (two) times daily. ?  ?fluticasone 50 MCG/ACT nasal spray ?Commonly known as: FLONASE ?Place 1 spray into both nostrils daily as needed for allergies. ?  ?folic acid 1 MG tablet ?Commonly known as: FOLVITE ?Take 1 mg by mouth daily. ?  ?ibuprofen 200 MG tablet ?Commonly known as: ADVIL ?Take 800 mg by mouth every 6 (six) hours as needed for headache or moderate pain. ?  ?levETIRAcetam 500 MG tablet ?Commonly known as: KEPPRA ?Take 1 tablet (500 mg total) by mouth 2 (two) times daily. ?  ?loratadine 10 MG tablet ?Commonly known as: CLARITIN ?Take 10 mg by mouth daily as needed for allergies. ?  ? ?  ?  ?Discharge Instructions/ Education: ?Discharge instructions given to patient and mother with verbalized understanding. ?Discharge education completed with patient/family including: follow up  instructions, medication list, discharge activities, and limitations if indicated. Patient instructed to return to Emergency Department, call 911, or call MD for any changes in condition.  ?Patient escorted via wheelchair to lobby and discharged home via private automobile. Provided with signed Doctors notes for both patient and mother from discharging hospitalist Dr. Janee Morn.  ? ?

## 2021-08-31 NOTE — ED Notes (Signed)
Placed breakfast order ?
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
## 2021-09-27 ENCOUNTER — Other Ambulatory Visit: Payer: Self-pay | Admitting: Neurology

## 2021-10-07 ENCOUNTER — Ambulatory Visit: Payer: Medicaid Other | Admitting: Neurology

## 2021-10-07 ENCOUNTER — Encounter: Payer: Self-pay | Admitting: Neurology

## 2021-10-07 VITALS — BP 110/68 | HR 53 | Ht 62.0 in | Wt 112.0 lb

## 2021-10-07 DIAGNOSIS — G40909 Epilepsy, unspecified, not intractable, without status epilepticus: Secondary | ICD-10-CM

## 2021-10-07 DIAGNOSIS — Z5181 Encounter for therapeutic drug level monitoring: Secondary | ICD-10-CM

## 2021-10-07 MED ORDER — LEVETIRACETAM 1000 MG PO TABS: 1000.0000 mg | ORAL_TABLET | Freq: Two times a day (BID) | ORAL | 3 refills | Status: DC

## 2021-10-07 MED ORDER — AMITRIPTYLINE HCL 25 MG PO TABS: 25.0000 mg | ORAL_TABLET | Freq: Every day | ORAL | 3 refills | Status: DC

## 2021-10-07 MED ORDER — ONDANSETRON 4 MG PO TBDP: 4.0000 mg | ORAL_TABLET | Freq: Three times a day (TID) | ORAL | 0 refills | Status: AC | PRN

## 2021-10-07 NOTE — Progress Notes (Signed)
[facility]  PATIENT: Janet Hurst DOB: Feb 20, 2001  REQUESTING CLINICIAN: Eula Fried, MD HISTORY FROM: Patient and mother  REASON FOR VISIT: New onset seizure   HISTORICAL  CHIEF COMPLAINT:  Chief Complaint  Patient presents with   Hospitalization Follow-up    Rm 16. Accompanied by mother. ED req f/u for seizures.   INTERVAL HISTORY 08/30/2021:  Patient presents today for follow-up, she is accompanied by her mother.  At last visit plan was to discontinue topiramate but a couple weeks later she had a breakthrough seizure, described as generalized convulsion, she total of 4 events.  She was admitted to the hospital, levetiracetam was restarted, EEG was normal.  Since discharge from the hospital she report compliance with levetiracetam 1000 mg twice daily, denies any additional seizures and denies any side effect.   INTERVAL HISTORY 08/17/2021:  Patient presents today for follow-up, she is accompanied by her mother.  She denies any seizure or seizure-like activity since her last visit.  However, she reports side effect of mood swings, weight loss, change in appetite, and lack of sleep.  Patient reports the main side effect is mood swings that she attributed to lamotrigine.  She has self weaned herself from lamotrigine and currently taking 25 mg in the morning and 50 mg at night.  She is planning to decrease to 25 mg twice daily for 1 week then stop the medication.  In terms of her headaches, she reported her headaches are okay, she is on Topamax 50 mg twice daily and also would like to come off the medication or at least reduce it.    HISTORY OF PRESENT ILLNESS:  This is a 21 year old woman with past medical history of anxiety, PTSD, migraine headaches who is presenting after being admitted to the hospital for new onset seizure.  Patient reports on February 7 she had back-to-back seizures, a total of 4 seizures described as generalized convulsion.  She was  admitted at [facility] for total of 3 days.  She did have a brain MRI which showed no acute intracranial abnormality and her EEG did not show any seizures.  Due to her history of anxiety and migraine headaches she was started on topiramate.  Currently she is on topiramate 100 mg twice daily, describes side effect of brain fog, difficulty with concentrating and also feels like she is "more fast" meaning that her ADD symptoms are getting worse.  Since leaving the hospital she denies any additional seizure or seizure-like event.  States that her headaches are better controlled.  She denies any previous history of seizures, denies any seizure risk factors other than the fact that she was born 3 weeks premature, with symptom of crack withdrawal and was in the ICU for couple weeks. She does report a history of marijuana abuse and believes that the marijuana she used was laced with cocaine causing her seizures.  In the hospital her urine toxicology was positive for THC and cocaine.  Handedness: Right handed   Onset: February 7  Seizure Type: Generalized convulsion  Current frequency: 1 event of seizure cluster  Any injuries from seizures: Tongue biting  Seizure risk factors: Premature 3 weeks, was in the ICU for 2 weeks for drug withdrawal   Previous ASMs: Lamotrigine, Topiramate   Currenty ASMs: Levetiracetam 1000 mg BID   ASMs side effects: Brain fog, difficulty with concentration  Brain Images: Normal brain MRI  Previous EEGs: Normal EEG   OTHER MEDICAL CONDITIONS: Migraines headaches, Depression, PTSD   REVIEW  OF SYSTEMS: Full 14 system review of systems performed and negative with exception of: As noted in the HPI  ALLERGIES: Allergies  Allergen Reactions   Influenza Vaccines     Other reaction(s): Other (See Comments) Pt states it makes her sick with fever and nausea     HOME MEDICATIONS: Outpatient Medications Prior to Visit  Medication Sig Dispense Refill   acetaminophen  (TYLENOL) 500 MG tablet Take 1,000 mg by mouth every 6 (six) hours as needed for moderate pain or headache.     chlorhexidine (PERIDEX) 0.12 % solution Use as directed 15 mLs in the mouth or throat 2 (two) times daily. 120 mL 0   fluticasone (FLONASE) 50 MCG/ACT nasal spray Place 1 spray into both nostrils daily as needed for allergies.     folic acid (FOLVITE) 1 MG tablet Take 1 mg by mouth daily.     ibuprofen (ADVIL) 200 MG tablet Take 800 mg by mouth every 6 (six) hours as needed for headache or moderate pain.     loratadine (CLARITIN) 10 MG tablet Take 10 mg by mouth daily as needed for allergies.     levETIRAcetam (KEPPRA) 500 MG tablet Take 1 tablet (500 mg total) by mouth 2 (two) times daily. (Patient taking differently: Take 1,000 mg by mouth 2 (two) times daily.) 60 tablet 1   No facility-administered medications prior to visit.    PAST MEDICAL HISTORY: Past Medical History:  Diagnosis Date   Seizure (HCC)     PAST SURGICAL HISTORY: History reviewed. No pertinent surgical history.  FAMILY HISTORY: Family History  Adopted: Yes    SOCIAL HISTORY: Social History   Socioeconomic History   Marital status: Single    Spouse name: Not on file   Number of children: Not on file   Years of education: Not on file   Highest education level: Not on file  Occupational History   Not on file  Tobacco Use   Smoking status: Former    Packs/day: 0.50    Years: 2.00    Total pack years: 1.00    Types: Cigarettes   Smokeless tobacco: Never  Vaping Use   Vaping Use: Every day   Start date: 09/30/2013  Substance and Sexual Activity   Alcohol use: Not Currently   Drug use: Yes    Frequency: 7.0 times per week    Types: Marijuana   Sexual activity: Yes    Birth control/protection: Condom  Other Topics Concern   Not on file  Social History Narrative   Not on file   Social Determinants of Health   Financial Resource Strain: Not on file  Food Insecurity: Not on file   Transportation Needs: Not on file  Physical Activity: Not on file  Stress: Not on file  Social Connections: Not on file  Intimate Partner Violence: Not on file    PHYSICAL EXAM  GENERAL EXAM/CONSTITUTIONAL: Vitals:  Vitals:   10/07/21 1146  BP: 110/68  Pulse: (!) 53  Weight: 112 lb (50.8 kg)  Height: 5\' 2"  (1.575 m)     Body mass index is 20.49 kg/m. Wt Readings from Last 3 Encounters:  10/07/21 112 lb (50.8 kg)  08/29/21 99 lb 3.3 oz (45 kg)  08/17/21 101 lb (45.8 kg)   Patient is in no distress; well developed, nourished and groomed; neck is supple  EYES: Pupils round and reactive to light, Visual fields full to confrontation, Extraocular movements intacts,  No results found.  MUSCULOSKELETAL: Gait, strength, tone, movements noted  in Neurologic exam below  NEUROLOGIC: MENTAL STATUS:      No data to display         awake, alert, oriented to person, place and time recent and remote memory intact normal attention and concentration language fluent, comprehension intact, naming intact fund of knowledge appropriate  CRANIAL NERVE:  2nd, 3rd, 4th, 6th - pupils equal and reactive to light, visual fields full to confrontation, extraocular muscles intact, no nystagmus 5th - facial sensation symmetric 7th - facial strength symmetric 8th - hearing intact 9th - palate elevates symmetrically, uvula midline 11th - shoulder shrug symmetric 12th - tongue protrusion midline  MOTOR:  normal bulk and tone, full strength in the BUE, BLE  SENSORY:  normal and symmetric to light touch, pinprick, temperature, vibration  COORDINATION:  finger-nose-finger, fine finger movements normal  REFLEXES:  deep tendon reflexes present and symmetric  GAIT/STATION:  normal   DIAGNOSTIC DATA (LABS, IMAGING, TESTING) - I reviewed patient records, labs, notes, testing and imaging myself where available.  Lab Results  Component Value Date   WBC 9.1 08/31/2021   HGB 11.6 (L)  08/31/2021   HCT 35.6 (L) 08/31/2021   MCV 92.5 08/31/2021   PLT 371 08/31/2021      Component Value Date/Time   NA 137 08/31/2021 0552   K 3.3 (L) 08/31/2021 0552   CL 103 08/31/2021 0552   CO2 22 08/31/2021 0552   GLUCOSE 91 08/31/2021 0552   BUN 8 08/31/2021 0552   CREATININE 0.97 08/31/2021 0552   CALCIUM 8.9 08/31/2021 0552   GFRNONAA >60 08/31/2021 0552   No results found for: "CHOL", "HDL", "LDLCALC", "LDLDIRECT", "TRIG" Lab Results  Component Value Date   HGBA1C 5.3 08/30/2021   No results found for: "VITAMINB12" No results found for: "TSH"  Brain MRI with and without contrast 06/10/21 No MRI evidence of acute pathology within the brain, without evidence of  hemorrhage, mass effect, or infarct. No MRI correlate for seizure-like activity    Routine EEG 06/10/21 Normal EEG, however, do not rule out epilepsy.    EEG 08/30/21: normal     ASSESSMENT AND PLAN  21 y.o. year old female  with history of anxiety, PTSD and new onset seizure  who is presenting for follow-up.  She previously did well on Lamictal and topiramate but self discontinued due to side effect and had a breakthrough seizure.  She was restarted on levetiracetam.  Currently she is on levetiracetam 1000 mg twice daily, denies any side effect and reports compliance with the medication.  No seizures since restarting the medication.  When it comes to her headaches, reported the frequency of the headache is worse, she does not want to go back to topiramate, will try amitriptyline as preventive medication.  I will obtain a Keppra level today and I will see her in 3 months for follow-up.   1. Seizure disorder (HCC)   2. Therapeutic drug monitoring      Patient Instructions  Continue with levetiracetam 1000 mg twice daily We will check a levetiracetam level today Continue with folic acid supplementation Follow-up in 3 months or sooner if worse.  Please contact me if you have breakthrough seizure   Per [facility]  [place] statutes, patients with seizures are not allowed to drive until they have been seizure-free for six months.  Other recommendations include using caution when using heavy equipment or power tools. Avoid working on ladders or at heights. Take showers instead of baths.  Do not swim alone.  Ensure the water temperature is not too high on the home water heater. Do not go swimming alone. Do not lock yourself in a room alone (i.e. bathroom). When caring for infants or small children, sit down when holding, feeding, or changing them to minimize risk of injury to the child in the event you have a seizure. Maintain good sleep hygiene. Avoid alcohol.  Also recommend adequate sleep, hydration, good diet and minimize stress.   During the Seizure  - First, ensure adequate ventilation and place patients on the floor on their left side  Loosen clothing around the neck and ensure the airway is patent. If the patient is clenching the teeth, do not force the mouth open with any object as this can cause severe damage - Remove all items from the surrounding that can be hazardous. The patient may be oblivious to what's happening and may not even know what he or she is doing. If the patient is confused and wandering, either gently guide him/her away and block access to outside areas - Reassure the individual and be comforting - Call 911. In most cases, the seizure ends before EMS arrives. However, there are cases when seizures may last over 3 to 5 minutes. Or the individual may have developed breathing difficulties or severe injuries. If a pregnant patient or a person with diabetes develops a seizure, it is prudent to call an ambulance. - Finally, if the patient does not regain full consciousness, then call EMS. Most patients will remain confused for about 45 to 90 minutes after a seizure, so you must use judgment in calling for help. - Avoid restraints but make sure the patient is in a bed with padded side  rails - Place the individual in a lateral position with the neck slightly flexed; this will help the saliva drain from the mouth and prevent the tongue from falling backward - Remove all nearby furniture and other hazards from the area - Provide verbal assurance as the individual is regaining consciousness - Provide the patient with privacy if possible - Call for help and start treatment as ordered by the caregiver   After the Seizure (Postictal Stage)  After a seizure, most patients experience confusion, fatigue, muscle pain and/or a headache. Thus, one should permit the individual to sleep. For the next few days, reassurance is essential. Being calm and helping reorient the person is also of importance.  Most seizures are painless and end spontaneously. Seizures are not harmful to others but can lead to complications such as stress on the lungs, brain and the heart. Individuals with prior lung problems may develop labored breathing and respiratory distress.     Orders Placed This Encounter  Procedures   Levetiracetam level    Meds ordered this encounter  Medications   levETIRAcetam (KEPPRA) 1000 MG tablet    Sig: Take 1 tablet (1,000 mg total) by mouth 2 (two) times daily.    Dispense:  180 tablet    Refill:  3   amitriptyline (ELAVIL) 25 MG tablet    Sig: Take 1 tablet (25 mg total) by mouth at bedtime.    Dispense:  30 tablet    Refill:  3   ondansetron (ZOFRAN-ODT) 4 MG disintegrating tablet    Sig: Take 1 tablet (4 mg total) by mouth every 8 (eight) hours as needed for nausea or vomiting.    Dispense:  20 tablet    Refill:  0    Return in about 3 months (around 01/07/2022).  Windell Norfolk, MD 10/07/2021, 12:07 PM  [facility] [address], [address] [place], [place] [address] [phone]

## 2021-10-07 NOTE — Patient Instructions (Addendum)
Continue with levetiracetam 1000 mg twice daily We will check a levetiracetam level today Continue with folic acid supplementation Follow-up in 3 months or sooner if worse.  Please contact me if you have breakthrough seizure

## 2021-10-08 LAB — LEVETIRACETAM LEVEL: Levetiracetam Lvl: 35.7 ug/mL (ref 10.0–40.0)

## 2021-10-18 ENCOUNTER — Encounter: Payer: Self-pay | Admitting: Neurology

## 2021-10-18 ENCOUNTER — Telehealth: Payer: Self-pay | Admitting: Neurology

## 2021-10-18 NOTE — Telephone Encounter (Signed)
Dr. Teresa Coombs is out of the office until Wednesday. We will send this inquiry to our work-in MD and call her back.

## 2021-10-18 NOTE — Telephone Encounter (Addendum)
The patient sent the following mychart message today:  I had a seizure yesterday morning and the on-call neurologist had me take 2000mg  of keppra then and 1500mg  last night and 1500mg  this morning, so i'm just wondering how much I'm suppose to take on a daily basis now.  Dr. is out of the office until Wednesday. We will send this inquiry to our work-in MD and call her back.

## 2021-10-18 NOTE — Telephone Encounter (Signed)
Patient had a seizure yesterday. She is on 1000mg  bid keppra. I advised to take an extra dose at that time and increase to 1500mg  bid until you could get back to them and advise them or any different instructions you may want them to follow. thanks

## 2021-10-19 MED ORDER — LEVETIRACETAM 500 MG PO TABS: 500.0000 mg | ORAL_TABLET | Freq: Two times a day (BID) | ORAL | 5 refills | Status: DC

## 2021-10-19 NOTE — Telephone Encounter (Signed)
I have sent the patient a mychart message explaining Dr. Trevor Mace recommendation of keppra 1500mg  BID. The RX has been sent in to [facility].  I will send to Dr. for further consideration upon his return.

## 2021-10-20 NOTE — Telephone Encounter (Signed)
Please call and inform patient to continue with Keppra 1500 mg twice daily and contact us if she has another event.   Dr. Teresa Coombs

## 2021-10-20 NOTE — Telephone Encounter (Addendum)
I spoke to the patient's mother. She verbalized understanding of the new dosage. They will call us to report any further events. Reviewed [place] law - no driving until six months seizure free. Her mother will relay to the patient but says she already knows. Confirmed she is not driving now.

## 2021-10-30 ENCOUNTER — Other Ambulatory Visit: Payer: Self-pay | Admitting: Neurology

## 2021-11-01 NOTE — Telephone Encounter (Signed)
Rx refilled for 90 day supply. 

## 2021-11-30 ENCOUNTER — Encounter: Payer: Self-pay | Admitting: Neurology

## 2021-11-30 ENCOUNTER — Other Ambulatory Visit: Payer: Self-pay | Admitting: *Deleted

## 2021-11-30 MED ORDER — OXCARBAZEPINE 300 MG PO TABS: 300.0000 mg | ORAL_TABLET | Freq: Two times a day (BID) | ORAL | 5 refills | Status: DC

## 2021-11-30 NOTE — Telephone Encounter (Signed)
Per vo by Dr. Teresa Coombs, he will add on generic Trileptal 300mg , one tablet twice daily. He would also like to see her in the office sooner.  I called her mother and reviewed this plan. She will get her started on the new medication. She was instructed to notify our office for any further seizure activity. An appt has been scheduled on 12/14/21.

## 2021-12-14 ENCOUNTER — Ambulatory Visit: Payer: Medicaid Other | Admitting: Neurology

## 2021-12-14 ENCOUNTER — Encounter: Payer: Self-pay | Admitting: Neurology

## 2021-12-14 VITALS — BP 116/71 | HR 80 | Ht 62.0 in | Wt 108.5 lb

## 2021-12-14 DIAGNOSIS — F419 Anxiety disorder, unspecified: Secondary | ICD-10-CM | POA: Diagnosis not present

## 2021-12-14 DIAGNOSIS — G40909 Epilepsy, unspecified, not intractable, without status epilepticus: Secondary | ICD-10-CM | POA: Diagnosis not present

## 2021-12-14 DIAGNOSIS — Z5181 Encounter for therapeutic drug level monitoring: Secondary | ICD-10-CM | POA: Diagnosis not present

## 2021-12-14 MED ORDER — OXCARBAZEPINE 300 MG PO TABS: 300.0000 mg | ORAL_TABLET | Freq: Two times a day (BID) | ORAL | 3 refills | Status: DC

## 2021-12-14 MED ORDER — LEVETIRACETAM 500 MG PO TABS: 500.0000 mg | ORAL_TABLET | Freq: Two times a day (BID) | ORAL | 3 refills | Status: DC

## 2021-12-14 NOTE — Progress Notes (Signed)
[facility]  PATIENT: Janet Hurst DOB: [birth year]  REQUESTING CLINICIAN: Marnette Burgess, MD HISTORY FROM: Patient and mother  REASON FOR VISIT: New onset seizure   HISTORICAL  CHIEF COMPLAINT:  Chief Complaint  Patient presents with   Follow-up    Room 12 w/ mother, Malachy Mood. Reports last seizure occurred mid-July. She has continued Keppra 1500mg  BID. Trileptal 300mg  BID added 11/30/21. No further seizures. She is trying to set up counseling for her anxiety.     INTERNAL HISTORY 12/14/2021:  Janet Hurst presents today for follow-up, at last visit plan was to continue [place].  She did have additional seizures the last one was in the end of July and her Keppra was increased to 1500 mg twice daily and Trileptal was added 300 mg twice daily.  Since adding the Trileptal on August 1, she has not had any additional seizures.  She is tolerating the medication well except; sometimes in the morning she feels little tremors and balance issues but otherwise no additional side effects. She reports additional stress related to her job, due to the fact that she is wanting more hours at work.     INTERVAL HISTORY 08/30/2021:  Patient presents today for follow-up, she is accompanied by her mother.  At last visit plan was to discontinue topiramate but a couple weeks later she had a breakthrough seizure, described as generalized convulsion, she total of 4 events.  She was admitted to the hospital, levetiracetam was restarted, EEG was normal.  Since discharge from the hospital she report compliance with levetiracetam 1000 mg twice daily, denies any additional seizures and denies any side effect.   INTERVAL HISTORY 08/17/2021:  Patient presents today for follow-up, she is accompanied by her mother.  She denies any seizure or seizure-like activity since her last visit.  However, she reports side effect of mood swings, weight loss, change in appetite, and lack of sleep.  Patient reports  the main side effect is mood swings that she attributed to lamotrigine.  She has self weaned herself from lamotrigine and currently taking 25 mg in the morning and 50 mg at night.  She is planning to decrease to 25 mg twice daily for 1 week then stop the medication.  In terms of her headaches, she reported her headaches are okay, she is on Topamax 50 mg twice daily and also would like to come off the medication or at least reduce it.    HISTORY OF PRESENT ILLNESS:  This is a 21 year old woman with past medical history of anxiety, PTSD, migraine headaches who is presenting after being admitted to the hospital for new onset seizure.  Patient reports on February 7 she had back-to-back seizures, a total of 4 seizures described as generalized convulsion.  She was admitted at [facility] for total of 3 days.  She did have a brain MRI which showed no acute intracranial abnormality and her EEG did not show any seizures.  Due to her history of anxiety and migraine headaches she was started on topiramate.  Currently she is on topiramate 100 mg twice daily, describes side effect of brain fog, difficulty with concentrating and also feels like she is "more fast" meaning that her ADD symptoms are getting worse.  Since leaving the hospital she denies any additional seizure or seizure-like event.  States that her headaches are better controlled.  She denies any previous history of seizures, denies any seizure risk factors other than the fact that she was born 3 weeks premature, with symptom  of crack withdrawal and was in the ICU for couple weeks. She does report a history of marijuana abuse and believes that the marijuana she used was laced with cocaine causing her seizures.  In the hospital her urine toxicology was positive for THC and cocaine.  Handedness: Right handed   Onset: February 7  Seizure Type: Generalized convulsion  Current frequency: 1 event of seizure cluster  Any injuries from seizures: Tongue  biting  Seizure risk factors: Premature 3 weeks, was in the ICU for 2 weeks for drug withdrawal   Previous ASMs: Lamotrigine, Topiramate   Currenty ASMs: Levetiracetam 1500 mg BID, Trileptal 300 mg BID   ASMs side effects: Brain fog, difficulty with concentration (Topamax)  Brain Images: Normal brain MRI  Previous EEGs: Normal EEG   OTHER MEDICAL CONDITIONS: Migraines headaches, Depression, PTSD   REVIEW OF SYSTEMS: Full 14 system review of systems performed and negative with exception of: As noted in the HPI  ALLERGIES: Allergies  Allergen Reactions   Amitriptyline Other (See Comments)    "Irritability"   Influenza Vaccines     Other reaction(s): Other (See Comments) Pt states it makes her sick with fever and nausea     HOME MEDICATIONS: Outpatient Medications Prior to Visit  Medication Sig Dispense Refill   acetaminophen (TYLENOL) 500 MG tablet Take 1,000 mg by mouth every 6 (six) hours as needed for moderate pain or headache.     chlorhexidine (PERIDEX) 0.12 % solution Use as directed 15 mLs in the mouth or throat 2 (two) times daily. 120 mL 0   fluticasone (FLONASE) 50 MCG/ACT nasal spray Place 1 spray into both nostrils daily as needed for allergies.     folic acid (FOLVITE) 1 MG tablet Take 1 mg by mouth daily.     ibuprofen (ADVIL) 200 MG tablet Take 800 mg by mouth every 6 (six) hours as needed for headache or moderate pain.     levETIRAcetam (KEPPRA) 1000 MG tablet Take 1 tablet (1,000 mg total) by mouth 2 (two) times daily. 180 tablet 3   loratadine (CLARITIN) 10 MG tablet Take 10 mg by mouth daily as needed for allergies.     ondansetron (ZOFRAN-ODT) 4 MG disintegrating tablet Take 1 tablet (4 mg total) by mouth every 8 (eight) hours as needed for nausea or vomiting. 20 tablet 0   levETIRAcetam (KEPPRA) 500 MG tablet Take 1 tablet (500 mg total) by mouth 2 (two) times daily. Take in addition to keppra 1000mg  twice daily to make a total of 1500mg  twice daily. 60  tablet 5   Oxcarbazepine (TRILEPTAL) 300 MG tablet Take 1 tablet (300 mg total) by mouth 2 (two) times daily. 60 tablet 5   amitriptyline (ELAVIL) 25 MG tablet TAKE 1 TABLET BY MOUTH EVERYDAY AT BEDTIME 90 tablet 2   No facility-administered medications prior to visit.    PAST MEDICAL HISTORY: Past Medical History:  Diagnosis Date   Seizure (HCC)     PAST SURGICAL HISTORY: History reviewed. No pertinent surgical history.  FAMILY HISTORY: Family History  Adopted: Yes    SOCIAL HISTORY: Social History   Socioeconomic History   Marital status: Single    Spouse name: Not on file   Number of children: Not on file   Years of education: Not on file   Highest education level: Not on file  Occupational History   Not on file  Tobacco Use   Smoking status: Former    Packs/day: 0.50    Years: 2.00  Total pack years: 1.00    Types: Cigarettes   Smokeless tobacco: Never  Vaping Use   Vaping Use: Every day   Start date: 09/30/2013  Substance and Sexual Activity   Alcohol use: Not Currently   Drug use: Yes    Frequency: 7.0 times per week    Types: Marijuana   Sexual activity: Yes    Birth control/protection: Condom  Other Topics Concern   Not on file  Social History Narrative   Not on file   Social Determinants of Health   Financial Resource Strain: Not on file  Food Insecurity: Not on file  Transportation Needs: Not on file  Physical Activity: Not on file  Stress: Not on file  Social Connections: Not on file  Intimate Partner Violence: Not on file    PHYSICAL EXAM  GENERAL EXAM/CONSTITUTIONAL: Vitals:  Vitals:   12/14/21 0915  BP: 116/71  Pulse: 80  Weight: 108 lb 8 oz (49.2 kg)  Height: 5\' 2"  (1.575 m)     Body mass index is 19.84 kg/m. Wt Readings from Last 3 Encounters:  12/14/21 108 lb 8 oz (49.2 kg)  10/07/21 112 lb (50.8 kg)  08/29/21 99 lb 3.3 oz (45 kg)   Patient is in no distress; well developed, nourished and groomed; neck is  supple  EYES: Pupils round and reactive to light, Visual fields full to confrontation, Extraocular movements intacts,  No results found.  MUSCULOSKELETAL: Gait, strength, tone, movements noted in Neurologic exam below  NEUROLOGIC: MENTAL STATUS:      No data to display         awake, alert, oriented to person, place and time recent and remote memory intact normal attention and concentration language fluent, comprehension intact, naming intact fund of knowledge appropriate  CRANIAL NERVE:  2nd, 3rd, 4th, 6th - pupils equal and reactive to light, visual fields full to confrontation, extraocular muscles intact, no nystagmus 5th - facial sensation symmetric 7th - facial strength symmetric 8th - hearing intact 9th - palate elevates symmetrically, uvula midline 11th - shoulder shrug symmetric 12th - tongue protrusion midline  MOTOR:  normal bulk and tone, full strength in the BUE, BLE  SENSORY:  normal and symmetric to light touch, pinprick, temperature, vibration  COORDINATION:  finger-nose-finger, fine finger movements normal  REFLEXES:  deep tendon reflexes present and symmetric  GAIT/STATION:  normal   DIAGNOSTIC DATA (LABS, IMAGING, TESTING) - I reviewed patient records, labs, notes, testing and imaging myself where available.  Lab Results  Component Value Date   WBC 9.1 08/31/2021   HGB 11.6 (L) 08/31/2021   HCT 35.6 (L) 08/31/2021   MCV 92.5 08/31/2021   PLT 371 08/31/2021      Component Value Date/Time   NA 137 08/31/2021 0552   K 3.3 (L) 08/31/2021 0552   CL 103 08/31/2021 0552   CO2 22 08/31/2021 0552   GLUCOSE 91 08/31/2021 0552   BUN 8 08/31/2021 0552   CREATININE 0.97 08/31/2021 0552   CALCIUM 8.9 08/31/2021 0552   GFRNONAA >60 08/31/2021 0552   No results found for: "CHOL", "HDL", "LDLCALC", "LDLDIRECT", "TRIG" Lab Results  Component Value Date   HGBA1C 5.3 08/30/2021   No results found for: "VITAMINB12" No results found for:  "TSH"  Brain MRI with and without contrast 06/10/21 No MRI evidence of acute pathology within the brain, without evidence of  hemorrhage, mass effect, or infarct. No MRI correlate for seizure-like activity    Routine EEG 06/10/21 Normal EEG, however, do  not rule out epilepsy.    EEG 08/30/21: normal     ASSESSMENT AND PLAN  21 y.o. year old female  with history of anxiety, PTSD and new onset seizure  who is presenting for follow-up.  Currently on Keppra 1500 mg twice daily and Trileptal 300 mg twice daily, denies any additional seizures.  Reports at time mild tremor and balance issue but otherwise no additional side effects from the medications.    1. Seizure disorder ([place])   2. Therapeutic drug monitoring   3. Anxiety      Patient Instructions  Continue with Keppra 1500 mg twice daily Continue with Trileptal 300 mg twice daily We will check antiseizure level today with vitamin D level Follow-up in 6 months or sooner if worse.  Please contact us if you do have a breakthrough seizure.   Per [facility] statutes, patients with seizures are not allowed to drive until they have been seizure-free for six months.  Other recommendations include using caution when using heavy equipment or power tools. Avoid working on ladders or at heights. Take showers instead of baths.  Do not swim alone.  Ensure the water temperature is not too high on the home water heater. Do not go swimming alone. Do not lock yourself in a room alone (i.e. bathroom). When caring for infants or small children, sit down when holding, feeding, or changing them to minimize risk of injury to the child in the event you have a seizure. Maintain good sleep hygiene. Avoid alcohol.  Also recommend adequate sleep, hydration, good diet and minimize stress.   During the Seizure  - First, ensure adequate ventilation and place patients on the floor on their left side  Loosen clothing around the neck and ensure the airway is  patent. If the patient is clenching the teeth, do not force the mouth open with any object as this can cause severe damage - Remove all items from the surrounding that can be hazardous. The patient may be oblivious to what's happening and may not even know what he or she is doing. If the patient is confused and wandering, either gently guide him/her away and block access to outside areas - Reassure the individual and be comforting - Call 911. In most cases, the seizure ends before EMS arrives. However, there are cases when seizures may last over 3 to 5 minutes. Or the individual may have developed breathing difficulties or severe injuries. If a pregnant patient or a person with diabetes develops a seizure, it is prudent to call an ambulance. - Finally, if the patient does not regain full consciousness, then call EMS. Most patients will remain confused for about 45 to 90 minutes after a seizure, so you must use judgment in calling for help. - Avoid restraints but make sure the patient is in a bed with padded side rails - Place the individual in a lateral position with the neck slightly flexed; this will help the saliva drain from the mouth and prevent the tongue from falling backward - Remove all nearby furniture and other hazards from the area - Provide verbal assurance as the individual is regaining consciousness - Provide the patient with privacy if possible - Call for help and start treatment as ordered by the caregiver   After the Seizure (Postictal Stage)  After a seizure, most patients experience confusion, fatigue, muscle pain and/or a headache. Thus, one should permit the individual to sleep. For the next few days, reassurance is essential. Being calm and  helping reorient the person is also of importance.  Most seizures are painless and end spontaneously. Seizures are not harmful to others but can lead to complications such as stress on the lungs, brain and the heart. Individuals with prior  lung problems may develop labored breathing and respiratory distress.     Orders Placed This Encounter  Procedures   Levetiracetam level   10-Hydroxycarbazepine   Vitamin D, 25-hydroxy    Meds ordered this encounter  Medications   Oxcarbazepine (TRILEPTAL) 300 MG tablet    Sig: Take 1 tablet (300 mg total) by mouth 2 (two) times daily.    Dispense:  180 tablet    Refill:  3   levETIRAcetam (KEPPRA) 500 MG tablet    Sig: Take 1 tablet (500 mg total) by mouth 2 (two) times daily. Take in addition to keppra 1000mg  twice daily to make a total of 1500mg  twice daily.    Dispense:  180 tablet    Refill:  3    Return in about 6 months (around 06/16/2022).   , MD 12/14/2021, 12:11 PM  [facility] [address], [address] [place], [address] [place] [phone]

## 2021-12-14 NOTE — Patient Instructions (Addendum)
Continue with Keppra 1500 mg twice daily Continue with Trileptal 300 mg twice daily We will check antiseizure level today with vitamin D level Follow-up in 6 months or sooner if worse.  Please contact us if you do have a breakthrough seizure.

## 2021-12-16 LAB — VITAMIN D 25 HYDROXY (VIT D DEFICIENCY, FRACTURES): Vit D, 25-Hydroxy: 29.8 ng/mL — ABNORMAL LOW (ref 30.0–100.0)

## 2021-12-16 LAB — LEVETIRACETAM LEVEL: Levetiracetam Lvl: 18.4 ug/mL (ref 10.0–40.0)

## 2021-12-16 LAB — 10-HYDROXYCARBAZEPINE: Oxcarbazepine SerPl-Mcnc: 7 ug/mL — ABNORMAL LOW (ref 10–35)

## 2021-12-28 ENCOUNTER — Encounter: Payer: Self-pay | Admitting: Neurology

## 2022-01-12 ENCOUNTER — Ambulatory Visit: Payer: Medicaid Other | Admitting: Neurology

## 2022-05-15 ENCOUNTER — Encounter: Payer: Self-pay | Admitting: Neurology

## 2022-05-16 MED ORDER — LEVETIRACETAM 1000 MG PO TABS: 1000.0000 mg | ORAL_TABLET | Freq: Two times a day (BID) | ORAL | 3 refills | Status: DC

## 2022-05-16 MED ORDER — LEVETIRACETAM 500 MG PO TABS: 500.0000 mg | ORAL_TABLET | Freq: Two times a day (BID) | ORAL | 3 refills | Status: DC

## 2022-06-27 ENCOUNTER — Encounter: Payer: Self-pay | Admitting: Neurology

## 2022-06-27 ENCOUNTER — Ambulatory Visit: Payer: Medicaid Other | Admitting: Neurology

## 2022-06-27 VITALS — BP 116/72 | HR 82 | Ht 62.0 in | Wt 125.0 lb

## 2022-06-27 DIAGNOSIS — G40909 Epilepsy, unspecified, not intractable, without status epilepticus: Secondary | ICD-10-CM | POA: Diagnosis not present

## 2022-06-27 DIAGNOSIS — Z5181 Encounter for therapeutic drug level monitoring: Secondary | ICD-10-CM | POA: Diagnosis not present

## 2022-06-27 MED ORDER — OXCARBAZEPINE 300 MG PO TABS: 300.0000 mg | ORAL_TABLET | Freq: Two times a day (BID) | ORAL | 3 refills | Status: DC

## 2022-06-27 NOTE — Patient Instructions (Signed)
Continue with levetiracetam 1500 mg twice daily Continue Trileptal 300 mg twice daily Will obtain antiseizure medication level today with vitamin D and iron studies Follow-up in 1 year or sooner if worse She has been seizure-free for more than 6 months, she can go back to driving

## 2022-06-27 NOTE — Progress Notes (Signed)
[facility]  PATIENT: Janet Hurst DOB: [birth year]  REQUESTING CLINICIAN: Marnette Burgess, MD HISTORY FROM: Patient and mother  REASON FOR VISIT: New onset seizure   HISTORICAL  CHIEF COMPLAINT:  Chief Complaint  Patient presents with   Follow-up    Rm 13,  States she is doing well with medications and reports no seizures     INTERVAL HISTORY 06/27/2022:  Janet Hurst presents today for follow-up, she is accompanied by her mother.  Last visit was in August of last year.  Since then she has been doing very well on Keppra 1500 mg twice daily and Trileptal 300 mg twice daily.  She denies any seizure or seizure-like activity.  Denies any side effect from the medications.  She does work at [employer] as a Freight forwarder, enjoys her job.  Denies any recent stressor overall she is doing very well   INTERNAL HISTORY 12/14/2021:  Janet Hurst presents today for follow-up, at last visit plan was to continue Janet Hurst.  She did have additional seizures the last one was in the end of July and her Keppra was increased to 1500 mg twice daily and Trileptal was added 300 mg twice daily.  Since adding the Trileptal on August 1, she has not had any additional seizures.  She is tolerating the medication well except; sometimes in the morning she feels little tremors and balance issues but otherwise no additional side effects. She reports additional stress related to her job, due to the fact that she is wanting more hours at work.    INTERVAL HISTORY 08/30/2021:  Patient presents today for follow-up, she is accompanied by her mother.  At last visit plan was to discontinue topiramate but a couple weeks later she had a breakthrough seizure, described as generalized convulsion, she total of 4 events.  She was admitted to the hospital, levetiracetam was restarted, EEG was normal.  Since discharge from the hospital she report compliance with levetiracetam 1000 mg twice daily, denies any additional  seizures and denies any side effect.   INTERVAL HISTORY 08/17/2021:  Patient presents today for follow-up, she is accompanied by her mother.  She denies any seizure or seizure-like activity since her last visit.  However, she reports side effect of mood swings, weight loss, change in appetite, and lack of sleep.  Patient reports the main side effect is mood swings that she attributed to lamotrigine.  She has self weaned herself from lamotrigine and currently taking 25 mg in the morning and 50 mg at night.  She is planning to decrease to 25 mg twice daily for 1 week then stop the medication.  In terms of her headaches, she reported her headaches are okay, she is on Topamax 50 mg twice daily and also would like to come off the medication or at least reduce it.    HISTORY OF PRESENT ILLNESS:  Janet Hurst with past medical history of anxiety, PTSD, migraine headaches who is presenting after being admitted to the hospital for new onset seizure.  Patient reports on February 7 she had back-to-back seizures, a total of 4 seizures described as generalized convulsion.  She was admitted at [facility] for total of 3 days.  She did have a brain MRI which showed no acute intracranial abnormality and her EEG did not show any seizures.  Due to her history of anxiety and migraine headaches she was started on topiramate.  Currently she is on topiramate 100 mg twice daily, describes side effect of brain fog,  difficulty with concentrating and also feels like she is "more fast" meaning that her ADD symptoms are getting worse.  Since leaving the hospital she denies any additional seizure or seizure-like event.  States that her headaches are better controlled.  She denies any previous history of seizures, denies any seizure risk factors other than the fact that she was born 3 weeks premature, with symptom of crack withdrawal and was in the ICU for couple weeks. She does report a history of marijuana abuse and  believes that the marijuana she used was laced with cocaine causing her seizures.  In the hospital her urine toxicology was positive for THC and cocaine.  Handedness: Right handed   Onset: February 7  Seizure Type: Generalized convulsion  Current frequency: 1 event of seizure cluster  Any injuries from seizures: Tongue biting  Seizure risk factors: Premature 3 weeks, was in the ICU for 2 weeks for drug withdrawal   Previous ASMs: Lamotrigine, Topiramate   Currenty ASMs: Levetiracetam 1500 mg BID, Trileptal 300 mg BID   ASMs side effects: Brain fog, difficulty with concentration (Topamax)  Brain Images: Normal brain MRI  Previous EEGs: Normal EEG   OTHER MEDICAL CONDITIONS: Migraines headaches, Depression, PTSD   REVIEW OF SYSTEMS: Full 14 system review of systems performed and negative with exception of: As noted in the HPI  ALLERGIES: Allergies  Allergen Reactions   Amitriptyline Other (See Comments)    "Irritability"   Influenza Vaccines     Other reaction(s): Other (See Comments) Pt states it makes her sick with fever and nausea     HOME MEDICATIONS: Outpatient Medications Prior to Visit  Medication Sig Dispense Refill   acetaminophen (TYLENOL) 500 MG tablet Take 1,000 mg by mouth every 6 (six) hours as needed for moderate pain or headache.     chlorhexidine (PERIDEX) 0.12 % solution Use as directed 15 mLs in the mouth or throat 2 (two) times daily. 120 mL 0   fluticasone (FLONASE) 50 MCG/ACT nasal spray Place 1 spray into both nostrils daily as needed for allergies.     folic acid (FOLVITE) 1 MG tablet Take 1 mg by mouth daily.     ibuprofen (ADVIL) 200 MG tablet Take 800 mg by mouth every 6 (six) hours as needed for headache or moderate pain.     levETIRAcetam (KEPPRA) 1000 MG tablet Take 1 tablet (1,000 mg total) by mouth 2 (two) times daily. 180 tablet 3   levETIRAcetam (KEPPRA) 500 MG tablet Take 1 tablet (500 mg total) by mouth 2 (two) times daily. Take in  addition to keppra '1000mg'$  twice daily to make a total of '1500mg'$  twice daily. 180 tablet 3   loratadine (CLARITIN) 10 MG tablet Take 10 mg by mouth daily as needed for allergies.     ondansetron (ZOFRAN-ODT) 4 MG disintegrating tablet Take 1 tablet (4 mg total) by mouth every 8 (eight) hours as needed for nausea or vomiting. 20 tablet 0   Oxcarbazepine (TRILEPTAL) 300 MG tablet Take 1 tablet (300 mg total) by mouth 2 (two) times daily. 180 tablet 3   No facility-administered medications prior to visit.    PAST MEDICAL HISTORY: Past Medical History:  Diagnosis Date   Seizure ([place])     PAST SURGICAL HISTORY: History reviewed. No pertinent surgical history.  FAMILY HISTORY: Family History  Adopted: Yes    SOCIAL HISTORY: Social History   Socioeconomic History   Marital status: Single    Spouse name: Not on file   Number of  children: Not on file   Years of education: Not on file   Highest education level: Not on file  Occupational History   Not on file  Tobacco Use   Smoking status: Former    Packs/day: 0.50    Years: 2.00    Total pack years: 1.00    Types: Cigarettes   Smokeless tobacco: Never  Vaping Use   Vaping Use: Every day   Start date: 09/30/2013  Substance and Sexual Activity   Alcohol use: Not Currently   Drug use: Yes    Frequency: 7.0 times per week    Types: Marijuana   Sexual activity: Yes    Birth control/protection: Condom  Other Topics Concern   Not on file  Social History Narrative   Not on file   Social Determinants of Health   Financial Resource Strain: Not on file  Food Insecurity: Not on file  Transportation Needs: Not on file  Physical Activity: Not on file  Stress: Not on file  Social Connections: Not on file  Intimate Partner Violence: Not on file    PHYSICAL EXAM  GENERAL EXAM/CONSTITUTIONAL: Vitals:  Vitals:   06/27/22 1438  BP: 116/72  Pulse: 82  Weight: 125 lb (56.7 kg)  Height: '5\' 2"'$  (1.575 m)     Body mass index  is 22.86 kg/m. Wt Readings from Last 3 Encounters:  06/27/22 125 lb (56.7 kg)  12/14/21 108 lb 8 oz (49.2 kg)  10/07/21 112 lb (50.8 kg)   Patient is in no distress; well developed, nourished and groomed; neck is supple  EYES: Visual fields full to confrontation, Extraocular movements intacts,  No results found.  MUSCULOSKELETAL: Gait, strength, tone, movements noted in Neurologic exam below  NEUROLOGIC: MENTAL STATUS:      No data to display         awake, alert, oriented to person, place and time recent and remote memory intact normal attention and concentration language fluent, comprehension intact, naming intact fund of knowledge appropriate  CRANIAL NERVE:  2nd, 3rd, 4th, 6th - pupils equal and reactive to light, visual fields full to confrontation, extraocular muscles intact, no nystagmus 5th - facial sensation symmetric 7th - facial strength symmetric 8th - hearing intact 9th - palate elevates symmetrically, uvula midline 11th - shoulder shrug symmetric 12th - tongue protrusion midline  MOTOR:  normal bulk and tone, full strength in the BUE, BLE  SENSORY:  normal and symmetric to light touch, pinprick, temperature, vibration  COORDINATION:  finger-nose-finger, fine finger movements normal  REFLEXES:  deep tendon reflexes present and symmetric  GAIT/STATION:  normal   DIAGNOSTIC DATA (LABS, IMAGING, TESTING) - I reviewed patient records, labs, notes, testing and imaging myself where available.  Lab Results  Component Value Date   WBC 9.1 08/31/2021   HGB 11.6 (L) 08/31/2021   HCT 35.6 (L) 08/31/2021   MCV 92.5 08/31/2021   PLT 371 08/31/2021      Component Value Date/Time   NA 137 08/31/2021 0552   K 3.3 (L) 08/31/2021 0552   CL 103 08/31/2021 0552   CO2 22 08/31/2021 0552   GLUCOSE 91 08/31/2021 0552   BUN 8 08/31/2021 0552   CREATININE 0.97 08/31/2021 0552   CALCIUM 8.9 08/31/2021 0552   GFRNONAA >60 08/31/2021 0552   No results  found for: "CHOL", "HDL", "LDLCALC", "LDLDIRECT", "TRIG" Lab Results  Component Value Date   HGBA1C 5.3 08/30/2021   No results found for: "VITAMINB12" No results found for: "TSH"  Brain MRI  with and without contrast 06/10/21 No MRI evidence of acute pathology within the brain, without evidence of  hemorrhage, mass effect, or infarct. No MRI correlate for seizure-like activity    Routine EEG 06/10/21 Normal EEG, however, do not rule out epilepsy.    EEG 08/30/21: normal     ASSESSMENT AND PLAN  22 y.o. year old female  with history of anxiety, PTSD and new onset seizure  who is presenting for follow-up.  Currently on Keppra 1500 mg twice daily and Trileptal 300 mg twice daily, denies any additional seizures.  Denies any side effect from the medication, overall she is doing well.  She has a new job and she enjoys it.  Plan will be to to continue current medications, and follow up in one year or sooner if worse.    1. Seizure disorder ([place])   2. Therapeutic drug monitoring       Patient Instructions  Continue with levetiracetam 1500 mg twice daily Continue Trileptal 300 mg twice daily Will obtain antiseizure medication level today with vitamin D and iron studies Follow-up in 1 year or sooner if worse She has been seizure-free for more than 6 months, she can go back to driving   Per [facility] statutes, patients with seizures are not allowed to drive until they have been seizure-free for six months.  Other recommendations include using caution when using heavy equipment or power tools. Avoid working on ladders or at heights. Take showers instead of baths.  Do not swim alone.  Ensure the water temperature is not too high on the home water heater. Do not go swimming alone. Do not lock yourself in a room alone (i.e. bathroom). When caring for infants or small children, sit down when holding, feeding, or changing them to minimize risk of injury to the child in the event you have a  seizure. Maintain good sleep hygiene. Avoid alcohol.  Also recommend adequate sleep, hydration, good diet and minimize stress.   During the Seizure  - First, ensure adequate ventilation and place patients on the floor on their left side  Loosen clothing around the neck and ensure the airway is patent. If the patient is clenching the teeth, do not force the mouth open with any object as Janet can cause severe damage - Remove all items from the surrounding that can be hazardous. The patient may be oblivious to what's happening and may not even know what he or she is doing. If the patient is confused and wandering, either gently guide him/her away and block access to outside areas - Reassure the individual and be comforting - Call 911. In most cases, the seizure ends before EMS arrives. However, there are cases when seizures may last over 3 to 5 minutes. Or the individual may have developed breathing difficulties or severe injuries. If a pregnant patient or a person with diabetes develops a seizure, it is prudent to call an ambulance. - Finally, if the patient does not regain full consciousness, then call EMS. Most patients will remain confused for about 45 to 90 minutes after a seizure, so you must use judgment in calling for help. - Avoid restraints but make sure the patient is in a bed with padded side rails - Place the individual in a lateral position with the neck slightly flexed; Janet will help the saliva drain from the mouth and prevent the tongue from falling backward - Remove all nearby furniture and other hazards from the area - Provide verbal assurance as  the individual is regaining consciousness - Provide the patient with privacy if possible - Call for help and start treatment as ordered by the caregiver   After the Seizure (Postictal Stage)  After a seizure, most patients experience confusion, fatigue, muscle pain and/or a headache. Thus, one should permit the individual to sleep. For  the next few days, reassurance is essential. Being calm and helping reorient the person is also of importance.  Most seizures are painless and end spontaneously. Seizures are not harmful to others but can lead to complications such as stress on the lungs, brain and the heart. Individuals with prior lung problems may develop labored breathing and respiratory distress.     Orders Placed Janet Encounter  Procedures   Levetiracetam level   10-Hydroxycarbazepine   Vitamin D, 25-hydroxy   Iron, TIBC and Ferritin Panel   Basic Metabolic Panel    Meds ordered Janet encounter  Medications   Oxcarbazepine (TRILEPTAL) 300 MG tablet    Sig: Take 1 tablet (300 mg total) by mouth 2 (two) times daily.    Dispense:  180 tablet    Refill:  3    Return in about 1 year (around 06/28/2023).   Alric Ran, MD 06/27/2022, 6:21 PM  [facility] [address] [place], [place] [address] [phone]

## 2022-06-29 ENCOUNTER — Telehealth: Payer: Self-pay

## 2022-06-29 DIAGNOSIS — Z0289 Encounter for other administrative examinations: Secondary | ICD-10-CM

## 2022-06-29 NOTE — Telephone Encounter (Signed)
Form fee paid. Brought to POD 2 for completion.

## 2022-06-29 NOTE — Telephone Encounter (Signed)
Received [place] DMV form for patient. Patient will call back to make form payment of $50

## 2022-06-30 LAB — BASIC METABOLIC PANEL
BUN/Creatinine Ratio: 12 (ref 9–23)
BUN: 9 mg/dL (ref 6–20)
CO2: 24 mmol/L (ref 20–29)
Calcium: 9.3 mg/dL (ref 8.7–10.2)
Chloride: 104 mmol/L (ref 96–106)
Creatinine, Ser: 0.78 mg/dL (ref 0.57–1.00)
Glucose: 89 mg/dL (ref 70–99)
Potassium: 4.4 mmol/L (ref 3.5–5.2)
Sodium: 139 mmol/L (ref 134–144)
eGFR: 111 mL/min/{1.73_m2} (ref >59)

## 2022-06-30 LAB — IRON,TIBC AND FERRITIN PANEL
Ferritin: 8 ng/mL — ABNORMAL LOW (ref 15–150)
Iron Saturation: 13 % — ABNORMAL LOW (ref 15–55)
Iron: 43 ug/dL (ref 27–159)
Total Iron Binding Capacity: 341 ug/dL (ref 250–450)
UIBC: 298 ug/dL (ref 131–425)

## 2022-06-30 LAB — VITAMIN D 25 HYDROXY (VIT D DEFICIENCY, FRACTURES): Vit D, 25-Hydroxy: 11.8 ng/mL — ABNORMAL LOW (ref 30.0–100.0)

## 2022-06-30 LAB — 10-HYDROXYCARBAZEPINE: Oxcarbazepine SerPl-Mcnc: 12 ug/mL (ref 10–35)

## 2022-06-30 LAB — LEVETIRACETAM LEVEL: Levetiracetam Lvl: 39.8 ug/mL (ref 10.0–40.0)

## 2022-06-30 MED ORDER — IRON (FERROUS SULFATE) 325 (65 FE) MG PO TABS: 325.0000 mg | ORAL_TABLET | Freq: Every day | ORAL | 1 refills | Status: DC

## 2022-06-30 MED ORDER — VITAMIN D (ERGOCALCIFEROL) 1.25 MG (50000 UNIT) PO CAPS: 50000.0000 [IU] | ORAL_CAPSULE | ORAL | 0 refills | Status: DC

## 2022-06-30 NOTE — Addendum Note (Signed)
Addended byAlric Ran on: 06/30/2022 11:02 AM   Modules accepted: Orders

## 2022-07-06 NOTE — Telephone Encounter (Signed)
Pt DMV form faxed on 07/06/22

## 2022-07-06 NOTE — Telephone Encounter (Signed)
Form completed and signed by Dr. April Manson.  To [facility].

## 2022-07-24 ENCOUNTER — Encounter: Payer: Self-pay | Admitting: Neurology

## 2022-07-27 ENCOUNTER — Telehealth: Payer: Self-pay | Admitting: *Deleted

## 2022-07-27 NOTE — Telephone Encounter (Signed)
Pt DMV form refax on 03/27/204

## 2022-07-29 ENCOUNTER — Other Ambulatory Visit: Payer: Self-pay | Admitting: Neurology

## 2022-09-21 ENCOUNTER — Other Ambulatory Visit: Payer: Self-pay | Admitting: Neurology

## 2022-09-21 NOTE — Telephone Encounter (Signed)
No. One time course.

## 2023-05-20 ENCOUNTER — Other Ambulatory Visit: Payer: Self-pay | Admitting: Neurology

## 2023-05-22 ENCOUNTER — Encounter: Payer: Self-pay | Admitting: Neurology

## 2023-05-22 ENCOUNTER — Other Ambulatory Visit: Payer: Self-pay | Admitting: Neurology

## 2023-05-23 MED ORDER — LEVETIRACETAM 500 MG PO TABS: 500.0000 mg | ORAL_TABLET | Freq: Two times a day (BID) | ORAL | 3 refills | Status: DC

## 2023-05-23 MED ORDER — LEVETIRACETAM 1000 MG PO TABS: 1000.0000 mg | ORAL_TABLET | Freq: Two times a day (BID) | ORAL | 3 refills | Status: DC

## 2023-06-19 ENCOUNTER — Other Ambulatory Visit: Payer: Self-pay | Admitting: Neurology

## 2023-06-28 ENCOUNTER — Ambulatory Visit: Payer: Medicaid Other | Admitting: Neurology

## 2023-06-28 ENCOUNTER — Encounter: Payer: Self-pay | Admitting: Neurology

## 2023-06-28 VITALS — BP 107/64 | Ht 62.0 in | Wt 139.0 lb

## 2023-06-28 DIAGNOSIS — G40909 Epilepsy, unspecified, not intractable, without status epilepticus: Secondary | ICD-10-CM | POA: Diagnosis not present

## 2023-06-28 DIAGNOSIS — R569 Unspecified convulsions: Secondary | ICD-10-CM

## 2023-06-28 MED ORDER — FOLIC ACID 1 MG PO TABS: 1.0000 mg | ORAL_TABLET | Freq: Every day | ORAL | 3 refills | Status: AC

## 2023-06-28 MED ORDER — LEVETIRACETAM 500 MG PO TABS: 500.0000 mg | ORAL_TABLET | Freq: Two times a day (BID) | ORAL | 3 refills | Status: AC

## 2023-06-28 MED ORDER — OXCARBAZEPINE 300 MG PO TABS: 300.0000 mg | ORAL_TABLET | Freq: Two times a day (BID) | ORAL | 3 refills | Status: AC

## 2023-06-28 MED ORDER — LEVETIRACETAM 1000 MG PO TABS
1000.0000 mg | ORAL_TABLET | Freq: Two times a day (BID) | ORAL | 3 refills | Status: DC
Start: 2023-06-28 — End: 2023-12-18

## 2023-06-28 NOTE — Patient Instructions (Addendum)
 Check labs today. Continue the Trileptal and Keppra for seizure prevention. Call for seizures. Referral to primary care. Recommend folic acid 1 mg daily. Recommend contraception. Call if you are planning pregnancy.   Meds ordered this encounter  Medications   levETIRAcetam (KEPPRA) 1000 MG tablet    Sig: Take 1 tablet (1,000 mg total) by mouth 2 (two) times daily.    Dispense:  180 tablet    Refill:  3   levETIRAcetam (KEPPRA) 500 MG tablet    Sig: Take 1 tablet (500 mg total) by mouth 2 (two) times daily. Take in addition to keppra 1000mg  twice daily to make a total of 1500mg  twice daily.    Dispense:  180 tablet    Refill:  3   Oxcarbazepine (TRILEPTAL) 300 MG tablet    Sig: Take 1 tablet (300 mg total) by mouth 2 (two) times daily.    Dispense:  180 tablet    Refill:  3   folic acid (FOLVITE) 1 MG tablet    Sig: Take 1 tablet (1 mg total) by mouth daily.    Dispense:  90 tablet    Refill:  3     [facility] [address] [place],  [place]  [address]  Get Driving Directions Main: [phone]

## 2023-06-28 NOTE — Progress Notes (Signed)
 Patient: Janet Hurst Date of Birth: 09-May-2000  Reason for Visit: Follow up History from: Patient, friend Primary Neurologist: [facility]  ASSESSMENT AND PLAN 23 y.o. year old female   1.  Seizures  -Last seizure in July 2023.  Currently doing very well. -Continue Keppra 1500 mg twice daily, Trileptal 300 mg twice daily -Check routine labs today -Last year she had low ferritin, vitamin D deficiency.  She does not have routine PCP follow-up, I will refer her to [facility] in [place] -Recommend folic acid 1 mg daily.  Currently not planning pregnancy, but not taking birth control.  Keppra, Lamictal, Trileptal are considered the safest options in pregnancy.  -Call for seizures, follow-up in 1 year or sooner if needed  Meds ordered this encounter  Medications   levETIRAcetam (KEPPRA) 1000 MG tablet    Sig: Take 1 tablet (1,000 mg total) by mouth 2 (two) times daily.    Dispense:  180 tablet    Refill:  3   levETIRAcetam (KEPPRA) 500 MG tablet    Sig: Take 1 tablet (500 mg total) by mouth 2 (two) times daily. Take in addition to keppra 1000mg  twice daily to make a total of 1500mg  twice daily.    Dispense:  180 tablet    Refill:  3   Oxcarbazepine (TRILEPTAL) 300 MG tablet    Sig: Take 1 tablet (300 mg total) by mouth 2 (two) times daily.    Dispense:  180 tablet    Refill:  3   folic acid (FOLVITE) 1 MG tablet    Sig: Take 1 tablet (1 mg total) by mouth daily.    Dispense:  90 tablet    Refill:  3   Orders Placed This Encounter  Procedures   CBC with Differential/Platelet   CMP   Levetiracetam level   10-Hydroxycarbazepine   Iron, TIBC and Ferritin Panel   Vitamin D, 25-hydroxy   Ambulatory referral to Internal Medicine   HISTORY OF PRESENT ILLNESS: Today 06/28/23 Last saw Dr. Teresa Coombs, doing very well on Keppra 1500 mg twice a day, Trileptal 300 mg twice a day.  Last seizures were in July 2023. Tolerates medications well. 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 Vitamin D was low 11.8, Ferritin was low at 8. Is a Merchandiser, retail at [employer]. Doesn't have a primary care doctor.  Not currently taking folic acid.  Is not planning pregnancy but is not taking pregnancy but does have a partner.   HISTORY  INTERVAL HISTORY 06/27/2022:  Janet Hurst presents today for follow-up, she is accompanied by her mother.  Last visit was in August of last year.  Since then she has been doing very well on Keppra 1500 mg twice daily and Trileptal 300 mg twice daily.  She denies any seizure or seizure-like activity.  Denies any side effect from the medications.  She does work at [employer] as a Production designer, theatre/television/film, enjoys her job.  Denies any recent stressor overall she is doing very well     INTERNAL HISTORY 12/14/2021:  Janet Hurst presents today for follow-up, at last visit plan was to continue Keppra.  She did have additional seizures the last one was in the end of July and her Keppra was increased to 1500 mg twice daily and Trileptal was added 300 mg twice daily.  Since adding the Trileptal on August 1, she has not had any additional seizures.  She is tolerating the medication well except; sometimes in the morning she feels little  tremors and balance issues but otherwise no additional side effects. She reports additional stress related to her job, due to the fact that she is wanting more hours at work.      INTERVAL HISTORY 08/30/2021:  Patient presents today for follow-up, she is accompanied by her mother.  At last visit plan was to discontinue topiramate but a couple weeks later she had a breakthrough seizure, described as generalized convulsion, she total of 4 events.  She was admitted to the hospital, levetiracetam was restarted, EEG was normal.  Since discharge from the hospital she report compliance with levetiracetam 1000 mg twice daily, denies any additional seizures and denies any side effect.     INTERVAL HISTORY 08/17/2021:  Patient presents today for follow-up, she is  accompanied by her mother.  She denies any seizure or seizure-like activity since her last visit.  However, she reports side effect of mood swings, weight loss, change in appetite, and lack of sleep.  Patient reports the main side effect is mood swings that she attributed to lamotrigine.  She has self weaned herself from lamotrigine and currently taking 25 mg in the morning and 50 mg at night.  She is planning to decrease to 25 mg twice daily for 1 week then stop the medication.  In terms of her headaches, she reported her headaches are okay, she is on Topamax 50 mg twice daily and also would like to come off the medication or at least reduce it.      HISTORY OF PRESENT ILLNESS:  This is a 23 year old woman with past medical history of anxiety, PTSD, migraine headaches who is presenting after being admitted to the hospital for new onset seizure.  Patient reports on February 7 she had back-to-back seizures, a total of 4 seizures described as generalized convulsion.  She was admitted at [facility] for total of 3 days.  She did have a brain MRI which showed no acute intracranial abnormality and her EEG did not show any seizures.  Due to her history of anxiety and migraine headaches she was started on topiramate.  Currently she is on topiramate 100 mg twice daily, describes side effect of brain fog, difficulty with concentrating and also feels like she is "more fast" meaning that her ADD symptoms are getting worse.  Since leaving the hospital she denies any additional seizure or seizure-like event.  States that her headaches are better controlled.  She denies any previous history of seizures, denies any seizure risk factors other than the fact that she was born 3 weeks premature, with symptom of crack withdrawal and was in the ICU for couple weeks. She does report a history of marijuana abuse and believes that the marijuana she used was laced with cocaine causing her seizures.  In the hospital her urine  toxicology was positive for THC and cocaine.   Handedness: Right handed    Onset: February 7   Seizure Type: Generalized convulsion   Current frequency: 1 event of seizure cluster   Any injuries from seizures: Tongue biting   Seizure risk factors: Premature 3 weeks, was in the ICU for 2 weeks for drug withdrawal    Previous ASMs: Lamotrigine, Topiramate    Currenty ASMs: Levetiracetam 1500 mg BID, Trileptal 300 mg BID    ASMs side effects: Brain fog, difficulty with concentration (Topamax)   Brain Images: Normal brain MRI   Previous EEGs: Normal EEG     OTHER MEDICAL CONDITIONS: Migraines headaches, Depression, PTSD     REVIEW OF  SYSTEMS: Out of a complete 14 system review of symptoms, the patient complains only of the following symptoms, and all other reviewed systems are negative.  See HPI  ALLERGIES: Allergies  Allergen Reactions   Amitriptyline Other (See Comments)    "Irritability"   Influenza Vaccines     Other reaction(s): Other (See Comments) Pt states it makes her sick with fever and nausea     HOME MEDICATIONS: Outpatient Medications Prior to Visit  Medication Sig Dispense Refill   acetaminophen (TYLENOL) 500 MG tablet Take 1,000 mg by mouth every 6 (six) hours as needed for moderate pain or headache.     ferrous sulfate 325 (65 FE) MG tablet TAKE 1 TABLET BY MOUTH EVERY DAY 90 tablet 1   fluticasone (FLONASE) 50 MCG/ACT nasal spray Place 1 spray into both nostrils daily as needed for allergies.     folic acid (FOLVITE) 1 MG tablet Take 1 mg by mouth daily.     ibuprofen (ADVIL) 200 MG tablet Take 800 mg by mouth every 6 (six) hours as needed for headache or moderate pain.     levETIRAcetam (KEPPRA) 1000 MG tablet Take 1 tablet (1,000 mg total) by mouth 2 (two) times daily. 180 tablet 3   levETIRAcetam (KEPPRA) 500 MG tablet Take 1 tablet (500 mg total) by mouth 2 (two) times daily. Take in addition to keppra 1000mg  twice daily to make a total of 1500mg   twice daily. 180 tablet 3   loratadine (CLARITIN) 10 MG tablet Take 10 mg by mouth daily as needed for allergies.     ondansetron (ZOFRAN-ODT) 4 MG disintegrating tablet Take 1 tablet (4 mg total) by mouth every 8 (eight) hours as needed for nausea or vomiting. 20 tablet 0   Oxcarbazepine (TRILEPTAL) 300 MG tablet TAKE 1 TABLET BY MOUTH 2 TIMES DAILY. 180 tablet 3   Vitamin D, Ergocalciferol, (DRISDOL) 1.25 MG (50000 UNIT) CAPS capsule Take 1 capsule (50,000 Units total) by mouth every 7 (seven) days. 12 capsule 0   chlorhexidine (PERIDEX) 0.12 % solution Use as directed 15 mLs in the mouth or throat 2 (two) times daily. (Patient not taking: Reported on 06/28/2023) 120 mL 0   No facility-administered medications prior to visit.    PAST MEDICAL HISTORY: Past Medical History:  Diagnosis Date   Seizure (HCC)     PAST SURGICAL HISTORY: History reviewed. No pertinent surgical history.  FAMILY HISTORY: Family History  Adopted: Yes    SOCIAL HISTORY: Social History   Socioeconomic History   Marital status: Single    Spouse name: Not on file   Number of children: Not on file   Years of education: Not on file   Highest education level: Not on file  Occupational History   Not on file  Tobacco Use   Smoking status: Every Day    Current packs/day: 0.50    Average packs/day: 0.5 packs/day for 2.0 years (1.0 ttl pk-yrs)    Types: Cigarettes   Smokeless tobacco: Former  Advertising account planner   Vaping status: Every Day   Start date: 09/30/2013  Substance and Sexual Activity   Alcohol use: Yes    Alcohol/week: 3.0 standard drinks of alcohol    Types: 3 Cans of beer per week    Comment: drinks once or twice a week   Drug use: Yes    Frequency: 7.0 times per week    Types: Marijuana   Sexual activity: Yes    Birth control/protection: Condom  Other Topics Concern  Not on file  Social History Narrative   Not on file   Social Drivers of Health   Financial Resource Strain: Not on file  Food  Insecurity: Not on file  Transportation Needs: Not on file  Physical Activity: Not on file  Stress: Not on file  Social Connections: Not on file  Intimate Partner Violence: Not on file    PHYSICAL EXAM  Vitals:   06/28/23 1435  BP: 107/64  Weight: 139 lb (63 kg)  Height: 5\' 2"  (1.575 m)   Body mass index is 25.42 kg/m.  Generalized: Well developed, in no acute distress  Neurological examination  Mentation: Alert oriented to time, place, history taking. Follows all commands speech and language fluent Cranial nerve II-XII: Pupils were equal round reactive to light. Extraocular movements were full, visual field were full on confrontational test. Facial sensation and strength were normal.  Head turning and shoulder shrug  were normal and symmetric. Motor: The motor testing reveals 5 over 5 strength of all 4 extremities. Good symmetric motor tone is noted throughout.  Sensory: Sensory testing is intact to soft touch on all 4 extremities. No evidence of extinction is noted.  Coordination: Cerebellar testing reveals good finger-nose-finger and heel-to-shin bilaterally.  Gait and station: Gait is normal.  Reflexes: Deep tendon reflexes are symmetric and normal bilaterally.   DIAGNOSTIC DATA (LABS, IMAGING, TESTING) - I reviewed patient records, labs, notes, testing and imaging myself where available.  Lab Results  Component Value Date   WBC 9.1 08/31/2021   HGB 11.6 (L) 08/31/2021   HCT 35.6 (L) 08/31/2021   MCV 92.5 08/31/2021   PLT 371 08/31/2021      Component Value Date/Time   NA 139 06/27/2022 1503   K 4.4 06/27/2022 1503   CL 104 06/27/2022 1503   CO2 24 06/27/2022 1503   GLUCOSE 89 06/27/2022 1503   GLUCOSE 91 08/31/2021 0552   BUN 9 06/27/2022 1503   CREATININE 0.78 06/27/2022 1503   CALCIUM 9.3 06/27/2022 1503   GFRNONAA >60 08/31/2021 0552   No results found for: "CHOL", "HDL", "LDLCALC", "LDLDIRECT", "TRIG", "CHOLHDL" Lab Results  Component Value Date    HGBA1C 5.3 08/30/2021   No results found for: "VITAMINB12" No results found for: "TSH"  Margie Ege, Edrick Oh, DNP 06/28/2023, 2:41 PM [facility] [address], [address] [place], [place] [address] [phone]

## 2023-07-02 LAB — COMPREHENSIVE METABOLIC PANEL
ALT: 13 IU/L (ref 0–32)
AST: 21 IU/L (ref 0–40)
Albumin: 4.6 g/dL (ref 4.0–5.0)
Alkaline Phosphatase: 67 IU/L (ref 44–121)
BUN/Creatinine Ratio: 10 (ref 9–23)
BUN: 8 mg/dL (ref 6–20)
Bilirubin Total: 0.2 mg/dL (ref 0.0–1.2)
CO2: 22 mmol/L (ref 20–29)
Calcium: 9.7 mg/dL (ref 8.7–10.2)
Chloride: 101 mmol/L (ref 96–106)
Creatinine, Ser: 0.78 mg/dL (ref 0.57–1.00)
Globulin, Total: 2.5 g/dL (ref 1.5–4.5)
Glucose: 84 mg/dL (ref 70–99)
Potassium: 4.3 mmol/L (ref 3.5–5.2)
Sodium: 137 mmol/L (ref 134–144)
Total Protein: 7.1 g/dL (ref 6.0–8.5)
eGFR: 110 mL/min/{1.73_m2} (ref >59)

## 2023-07-02 LAB — CBC WITH DIFFERENTIAL/PLATELET
Basophils Absolute: 0.1 10*3/uL (ref 0.0–0.2)
Basos: 1 %
EOS (ABSOLUTE): 0.8 10*3/uL — ABNORMAL HIGH (ref 0.0–0.4)
Eos: 10 %
Hematocrit: 37.1 % (ref 34.0–46.6)
Hemoglobin: 12.3 g/dL (ref 11.1–15.9)
Immature Grans (Abs): 0 10*3/uL (ref 0.0–0.1)
Immature Granulocytes: 0 %
Lymphocytes Absolute: 2 10*3/uL (ref 0.7–3.1)
Lymphs: 27 %
MCH: 30 pg (ref 26.6–33.0)
MCHC: 33.2 g/dL (ref 31.5–35.7)
MCV: 91 fL (ref 79–97)
Monocytes Absolute: 0.5 10*3/uL (ref 0.1–0.9)
Monocytes: 6 %
Neutrophils Absolute: 4 10*3/uL (ref 1.4–7.0)
Neutrophils: 56 %
Platelets: 383 10*3/uL (ref 150–450)
RBC: 4.1 x10E6/uL (ref 3.77–5.28)
RDW: 12.2 % (ref 11.7–15.4)
WBC: 7.3 10*3/uL (ref 3.4–10.8)

## 2023-07-02 LAB — IRON,TIBC AND FERRITIN PANEL
Ferritin: 22 ng/mL (ref 15–150)
Iron Saturation: 32 % (ref 15–55)
Iron: 107 ug/dL (ref 27–159)
Total Iron Binding Capacity: 335 ug/dL (ref 250–450)
UIBC: 228 ug/dL (ref 131–425)

## 2023-07-02 LAB — 10-HYDROXYCARBAZEPINE: Oxcarbazepine SerPl-Mcnc: 10 ug/mL (ref 10–35)

## 2023-07-02 LAB — VITAMIN D 25 HYDROXY (VIT D DEFICIENCY, FRACTURES): Vit D, 25-Hydroxy: 13.2 ng/mL — ABNORMAL LOW (ref 30.0–100.0)

## 2023-07-02 LAB — LEVETIRACETAM LEVEL: Levetiracetam Lvl: 32.8 ug/mL (ref 10.0–40.0)

## 2023-07-03 ENCOUNTER — Telehealth: Payer: Self-pay | Admitting: Neurology

## 2023-07-03 MED ORDER — VITAMIN D (ERGOCALCIFEROL) 1.25 MG (50000 UNIT) PO CAPS: 50000.0000 [IU] | ORAL_CAPSULE | ORAL | 0 refills | Status: AC

## 2023-07-03 NOTE — Telephone Encounter (Signed)
 Please call, Vitamin D level is low at 13.2. Will send in 50,000 units weekly x 12 weeks, then start taking 1000 units OTC daily. Recommend establishing with primary care, I referred her, can be rechecked then. The rest of the blood work is normal. Thanks

## 2023-07-03 NOTE — Telephone Encounter (Signed)
 Called pt and relayed [facility] message regarding lab results. Pt verbalized understanding. Pt had no additional questions at this time but was advised to call back if any questions arise.

## 2023-07-20 IMAGING — CT CT HEAD W/O CM
2 series · 15 of 30 positions shown, 17 images · non-contrast
Comparison: None.

CLINICAL DATA: Recent seizure activity



[Series 4: head wo · axial · 0.41mm/px · z∈[-329,-213]mm · 7 of 33 slices shown, 9 images]
[im 5/33  brain]
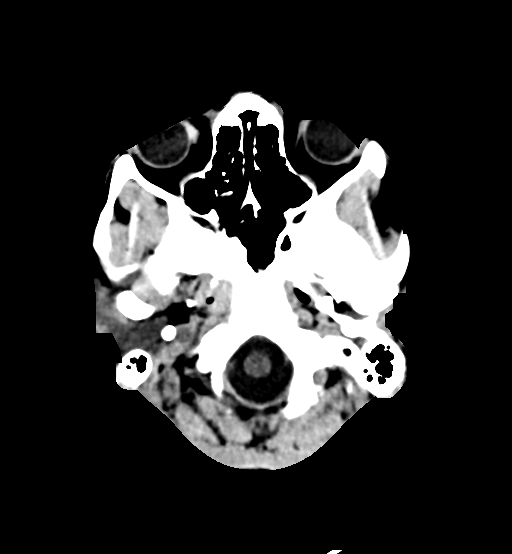
[im 5/33  bone]
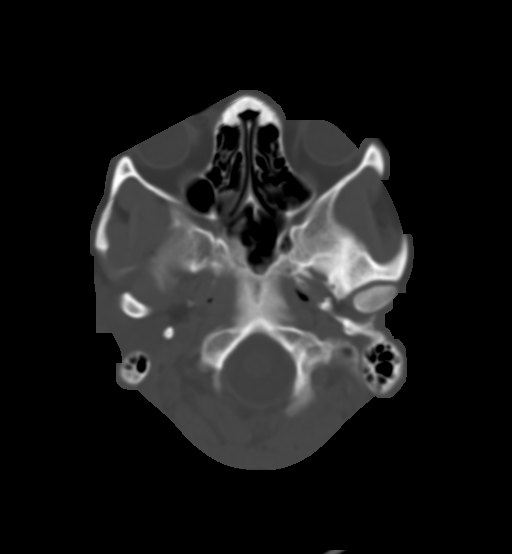
[im 9/33  brain]
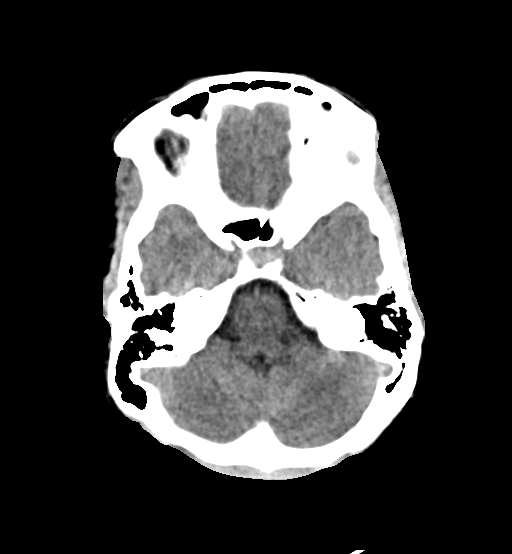
[im 13/33  brain]
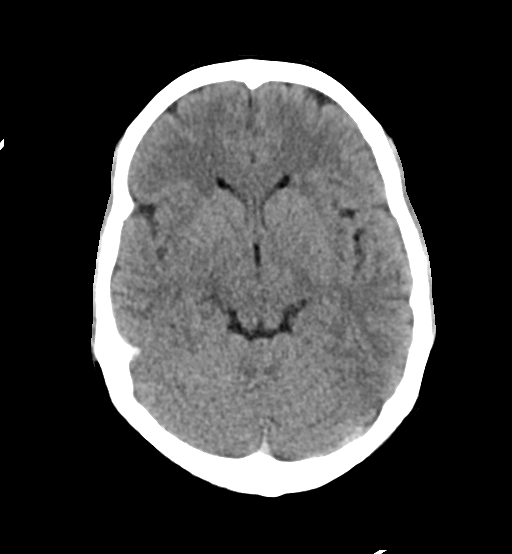
[im 17/33  brain]
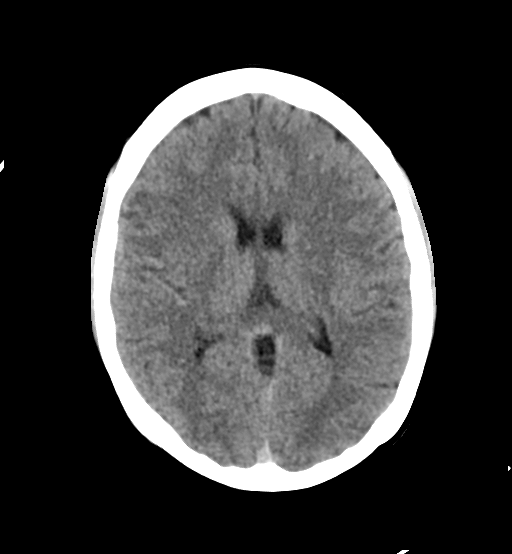
[im 21/33  brain]
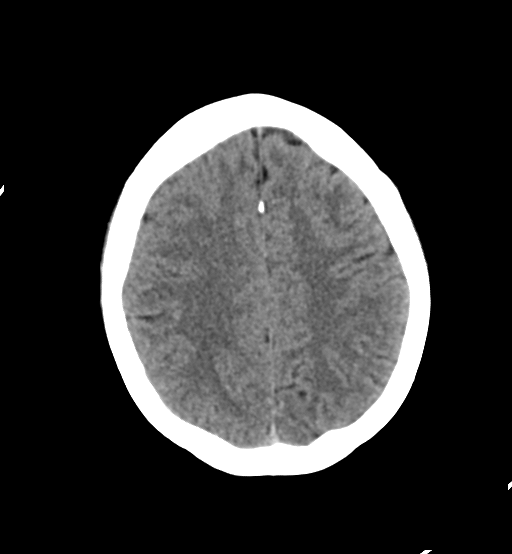
[im 21/33  bone]
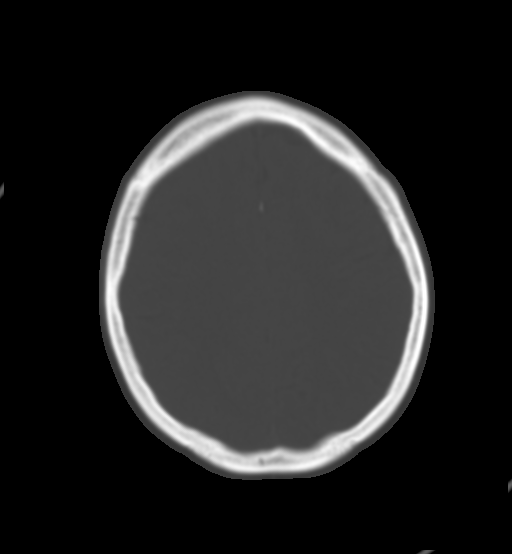
[im 25/33  brain]
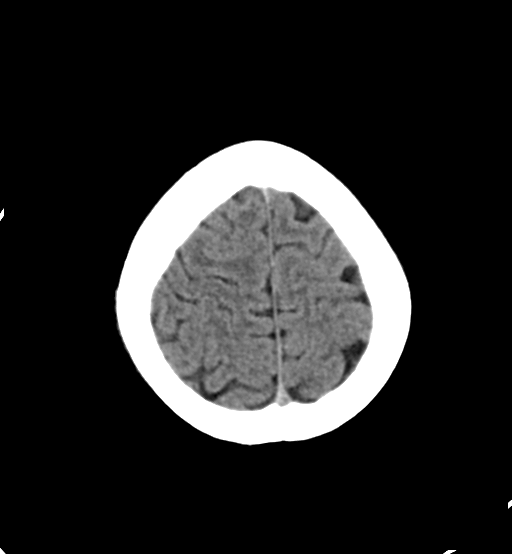
[im 29/33  brain]
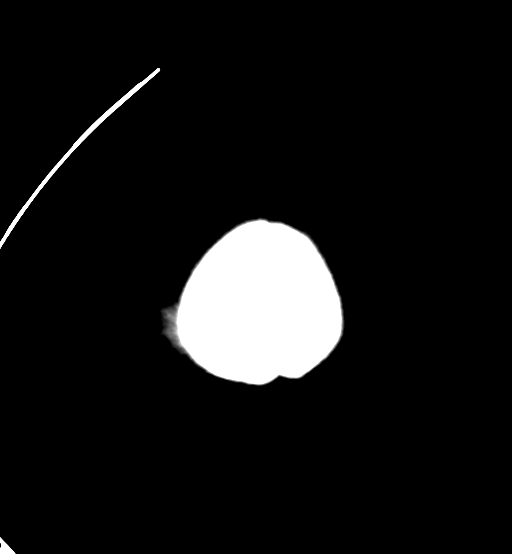

[Series 5: head bone · axial · 0.45mm/px · z∈[-326,-201]mm · 8 of 81 slices shown]
[im 9/81  bone]
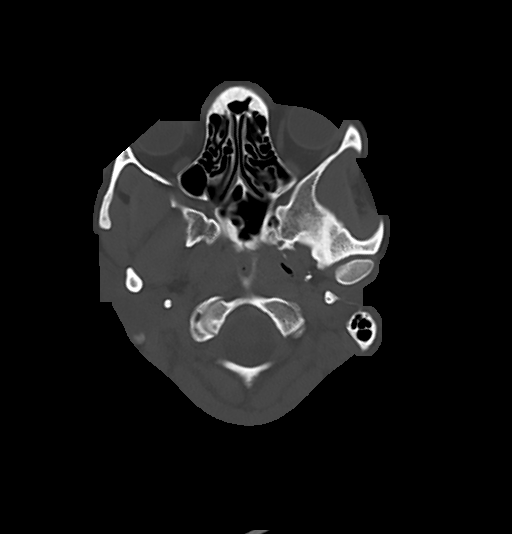
[im 17/81  bone]
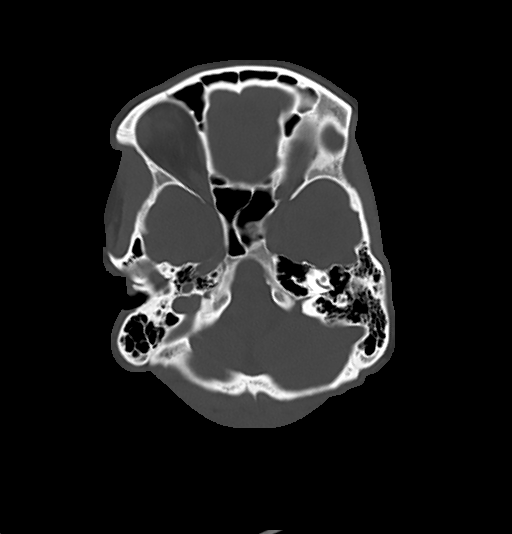
[im 25/81  bone]
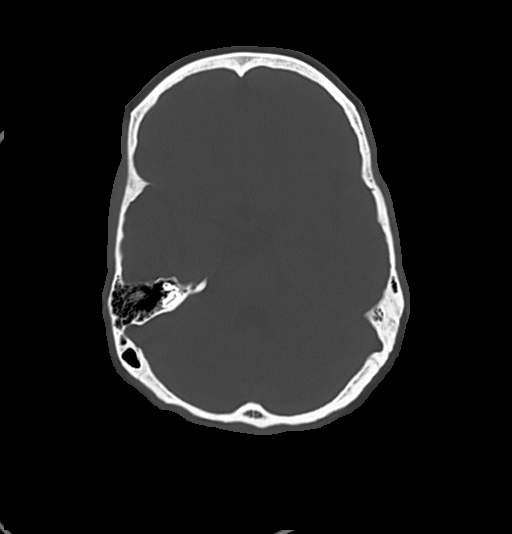
[im 37/81  bone]
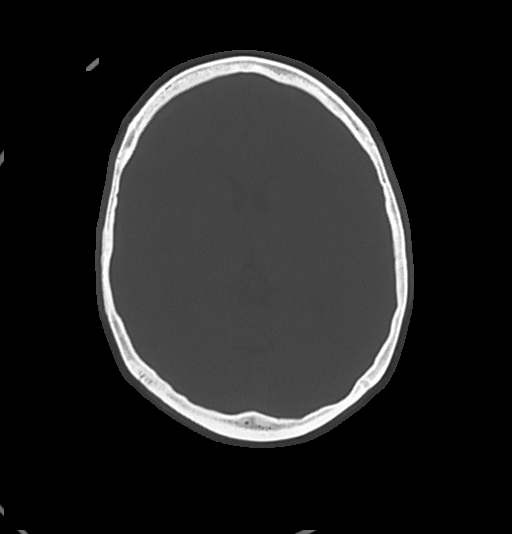
[im 45/81  bone]
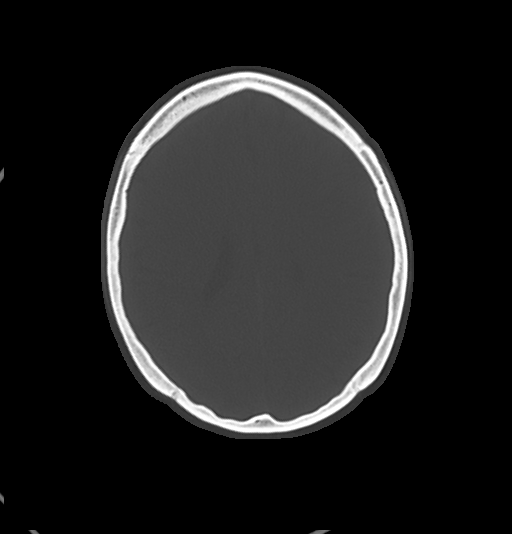
[im 57/81  bone]
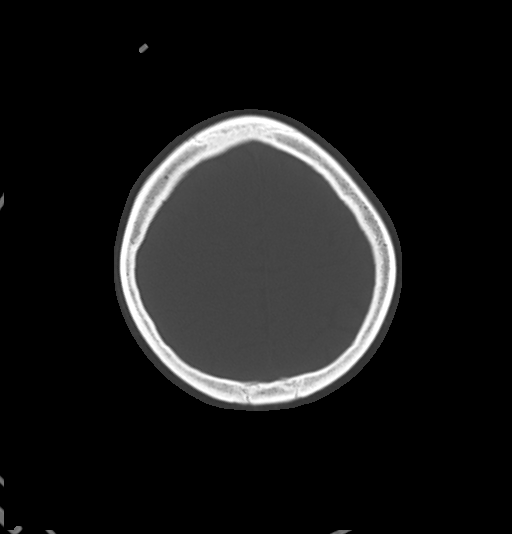
[im 65/81  bone]
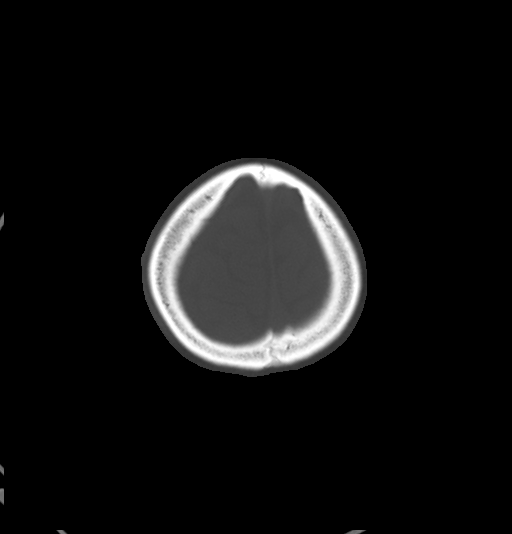
[im 73/81  bone]
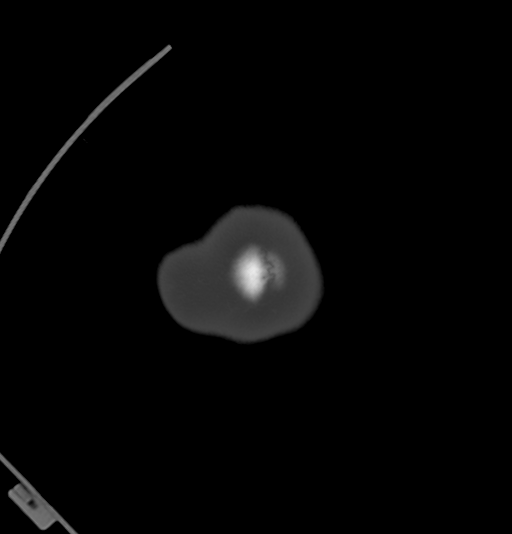

[15 of 30 positions shown; findings below may reference images not displayed]

FINDINGS: Brain: No evidence of acute infarction, hemorrhage, hydrocephalus,
extra-axial collection or mass lesion/mass effect.

Vascular: No hyperdense vessel or unexpected calcification.

Skull: Normal. Negative for fracture or focal lesion.

Sinuses/Orbits: No acute finding.

Other: None.
IMPRESSION: No acute intracranial abnormality noted.

## 2023-07-21 IMAGING — DX DG CHEST 1V PORT
1 series · 1 of 1 positions shown · non-contrast
Comparison: None.

CLINICAL DATA: Leukocytosis.

EXAM:
PORTABLE CHEST 1 VIEW

[chest ap]
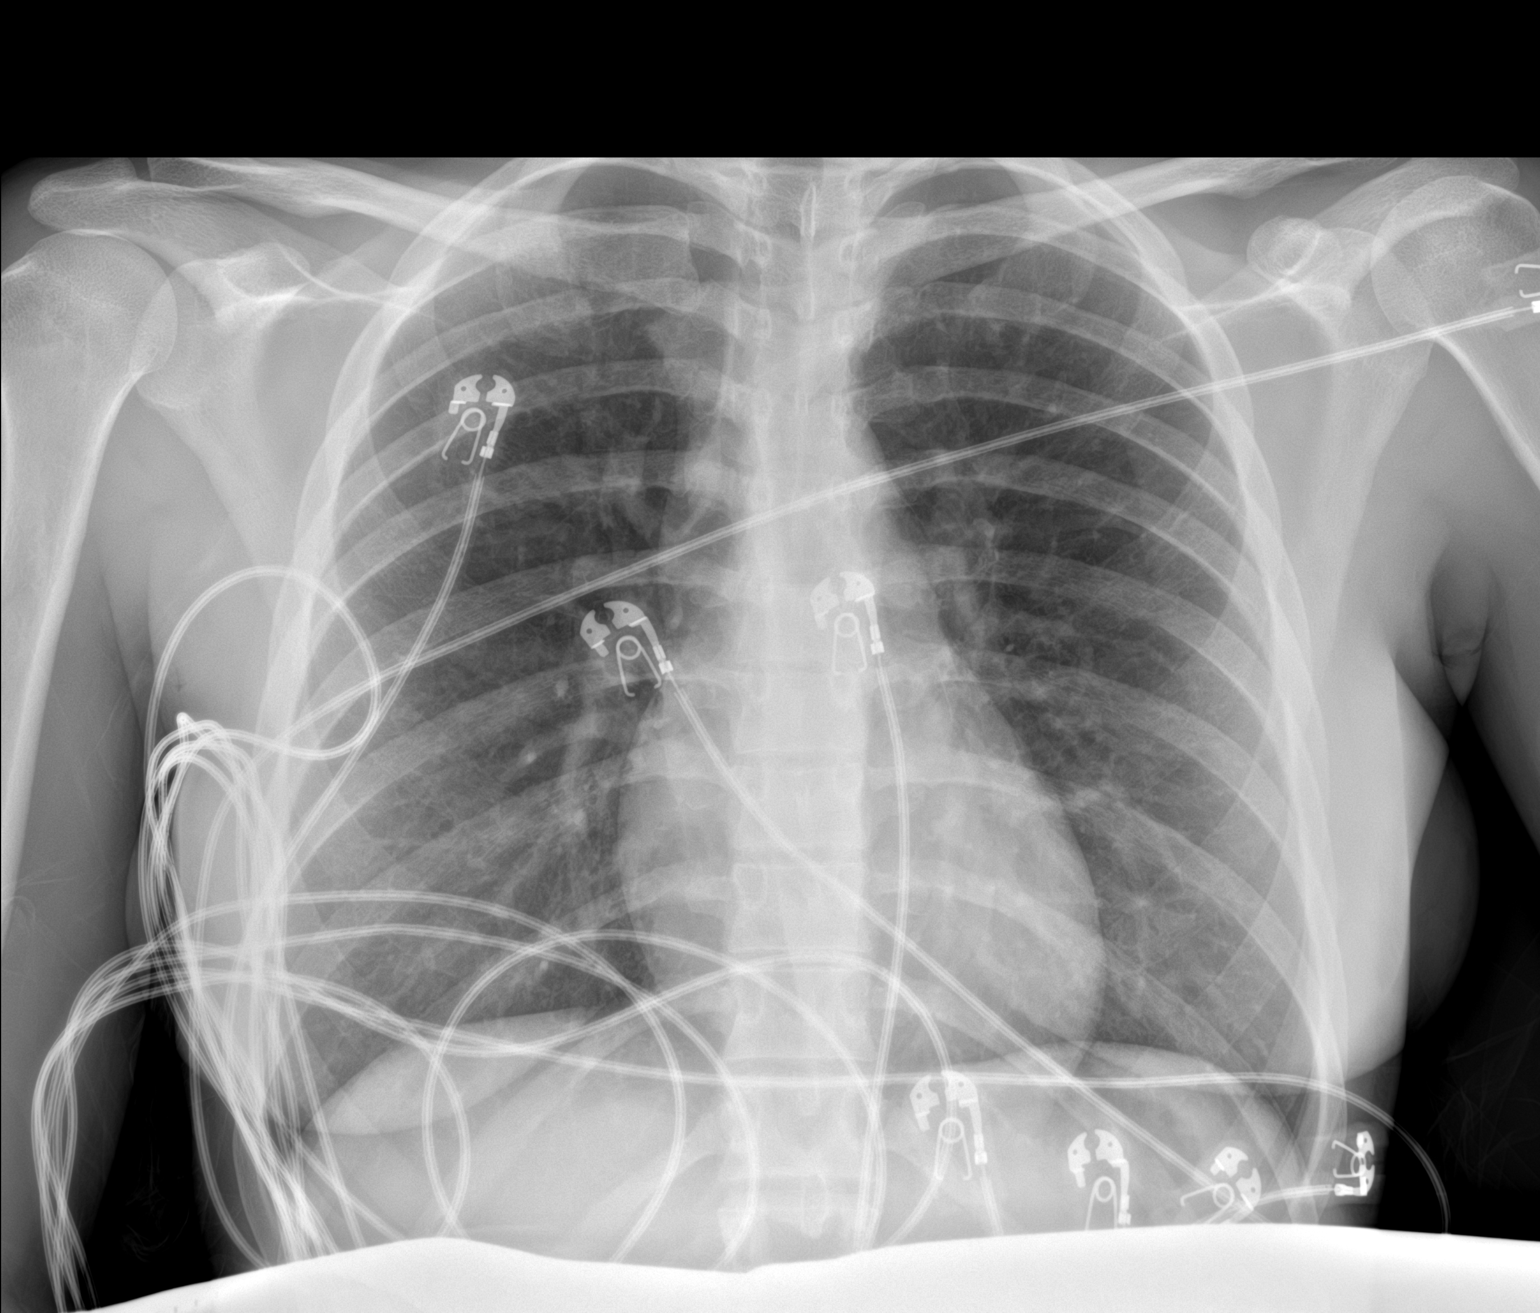

[1 of 1 positions shown; findings below may reference images not displayed]

FINDINGS: No consolidation. No visible pleural effusions or pneumothorax.
Cardiomediastinal silhouette is within normal limits. No displaced
fracture.
IMPRESSION: No evidence of acute cardiopulmonary disease.

## 2023-10-19 ENCOUNTER — Other Ambulatory Visit: Payer: Self-pay | Admitting: Neurology

## 2023-12-17 ENCOUNTER — Other Ambulatory Visit: Payer: Self-pay | Admitting: Neurology

## 2023-12-18 ENCOUNTER — Encounter: Payer: Self-pay | Admitting: Neurology

## 2024-01-16 NOTE — Telephone Encounter (Signed)
 Called [facility] and confirmed they have refill on file for levetiracetam  1000mg  tablet.

## 2024-03-15 ENCOUNTER — Other Ambulatory Visit: Payer: Self-pay | Admitting: Neurology

## 2024-03-17 ENCOUNTER — Encounter: Payer: Self-pay | Admitting: Neurology

## 2024-03-19 NOTE — Telephone Encounter (Signed)
 See 03/17/24 message for more information.

## 2024-06-24 ENCOUNTER — Ambulatory Visit: Admitting: Neurology

## 2024-06-27 ENCOUNTER — Ambulatory Visit: Payer: Medicaid Other | Admitting: Neurology
